# Patient Record
Sex: Female | Born: 1965 | Race: White | Hispanic: No | Smoking: Former smoker
Health system: Southern US, Community
[De-identification: ages and names within clinical notes are randomized; demographics above are authoritative.]

## PROBLEM LIST (undated history)

## (undated) DIAGNOSIS — T819XXA Unspecified complication of procedure, initial encounter: Secondary | ICD-10-CM

## (undated) DIAGNOSIS — F1010 Alcohol abuse, uncomplicated: Secondary | ICD-10-CM

## (undated) DIAGNOSIS — I10 Essential (primary) hypertension: Secondary | ICD-10-CM

## (undated) DIAGNOSIS — R4020 Unspecified coma: Secondary | ICD-10-CM

## (undated) DIAGNOSIS — K852 Alcohol induced acute pancreatitis without necrosis or infection: Secondary | ICD-10-CM

## (undated) HISTORY — PX: GASTRIC BYPASS: SHX52

## (undated) HISTORY — PX: CHOLECYSTECTOMY: SHX55

## (undated) HISTORY — PX: ABDOMINAL SURGERY: SHX537

---

## 1999-06-16 ENCOUNTER — Other Ambulatory Visit: Admission: RE | Admit: 1999-06-16 | Discharge: 1999-06-16 | Payer: Self-pay | Admitting: Obstetrics and Gynecology

## 1999-06-26 ENCOUNTER — Emergency Department (HOSPITAL_COMMUNITY): Admission: EM | Admit: 1999-06-26 | Discharge: 1999-06-26 | Payer: Self-pay | Admitting: Emergency Medicine

## 1999-06-27 ENCOUNTER — Ambulatory Visit (HOSPITAL_COMMUNITY): Admission: EM | Admit: 1999-06-27 | Discharge: 1999-06-27 | Payer: Self-pay | Admitting: Obstetrics and Gynecology

## 1999-06-27 ENCOUNTER — Encounter: Payer: Self-pay | Admitting: Obstetrics and Gynecology

## 1999-06-27 ENCOUNTER — Ambulatory Visit (HOSPITAL_COMMUNITY): Admission: RE | Admit: 1999-06-27 | Discharge: 1999-06-27 | Payer: Self-pay | Admitting: Urology

## 1999-06-27 ENCOUNTER — Encounter: Payer: Self-pay | Admitting: Urology

## 1999-07-05 ENCOUNTER — Encounter (INDEPENDENT_AMBULATORY_CARE_PROVIDER_SITE_OTHER): Payer: Self-pay

## 1999-07-05 ENCOUNTER — Ambulatory Visit (HOSPITAL_COMMUNITY): Admission: RE | Admit: 1999-07-05 | Discharge: 1999-07-05 | Payer: Self-pay | Admitting: Obstetrics and Gynecology

## 2001-11-14 ENCOUNTER — Other Ambulatory Visit: Admission: RE | Admit: 2001-11-14 | Discharge: 2001-11-14 | Payer: Self-pay | Admitting: Obstetrics and Gynecology

## 2003-05-21 ENCOUNTER — Other Ambulatory Visit: Admission: RE | Admit: 2003-05-21 | Discharge: 2003-05-21 | Payer: Self-pay | Admitting: Obstetrics and Gynecology

## 2003-06-10 ENCOUNTER — Encounter: Admission: RE | Admit: 2003-06-10 | Discharge: 2003-09-08 | Payer: Self-pay | Admitting: Internal Medicine

## 2003-12-16 ENCOUNTER — Emergency Department (HOSPITAL_COMMUNITY): Admission: EM | Admit: 2003-12-16 | Discharge: 2003-12-17 | Payer: Self-pay | Admitting: Emergency Medicine

## 2004-04-19 ENCOUNTER — Ambulatory Visit (HOSPITAL_COMMUNITY): Admission: RE | Admit: 2004-04-19 | Discharge: 2004-04-19 | Payer: Self-pay | Admitting: Neurology

## 2004-05-13 ENCOUNTER — Ambulatory Visit (HOSPITAL_COMMUNITY): Admission: RE | Admit: 2004-05-13 | Discharge: 2004-05-13 | Payer: Self-pay | Admitting: Neurology

## 2005-02-02 ENCOUNTER — Other Ambulatory Visit: Admission: RE | Admit: 2005-02-02 | Discharge: 2005-02-02 | Payer: Self-pay | Admitting: Obstetrics and Gynecology

## 2005-06-22 ENCOUNTER — Emergency Department (HOSPITAL_COMMUNITY): Admission: EM | Admit: 2005-06-22 | Discharge: 2005-06-22 | Payer: Self-pay | Admitting: Emergency Medicine

## 2005-07-27 ENCOUNTER — Emergency Department (HOSPITAL_COMMUNITY): Admission: EM | Admit: 2005-07-27 | Discharge: 2005-07-27 | Payer: Self-pay | Admitting: Emergency Medicine

## 2005-08-01 ENCOUNTER — Ambulatory Visit (HOSPITAL_COMMUNITY): Admission: RE | Admit: 2005-08-01 | Discharge: 2005-08-01 | Payer: Self-pay | Admitting: Urology

## 2006-04-12 ENCOUNTER — Observation Stay (HOSPITAL_COMMUNITY): Admission: EM | Admit: 2006-04-12 | Discharge: 2006-04-13 | Payer: Self-pay | Admitting: Family Medicine

## 2006-05-23 ENCOUNTER — Ambulatory Visit (HOSPITAL_COMMUNITY): Admission: RE | Admit: 2006-05-23 | Discharge: 2006-05-23 | Payer: Self-pay | Admitting: Obstetrics and Gynecology

## 2006-05-23 ENCOUNTER — Encounter (INDEPENDENT_AMBULATORY_CARE_PROVIDER_SITE_OTHER): Payer: Self-pay | Admitting: Obstetrics and Gynecology

## 2006-08-15 ENCOUNTER — Ambulatory Visit (HOSPITAL_COMMUNITY): Admission: RE | Admit: 2006-08-15 | Discharge: 2006-08-15 | Payer: Self-pay | Admitting: Surgery

## 2006-08-22 ENCOUNTER — Encounter: Admission: RE | Admit: 2006-08-22 | Discharge: 2006-08-22 | Payer: Self-pay | Admitting: Surgery

## 2006-08-27 ENCOUNTER — Ambulatory Visit (HOSPITAL_COMMUNITY): Admission: RE | Admit: 2006-08-27 | Discharge: 2006-08-27 | Payer: Self-pay | Admitting: Surgery

## 2007-02-26 ENCOUNTER — Emergency Department (HOSPITAL_COMMUNITY): Admission: EM | Admit: 2007-02-26 | Discharge: 2007-02-26 | Payer: Self-pay | Admitting: Emergency Medicine

## 2007-03-21 ENCOUNTER — Encounter: Admission: RE | Admit: 2007-03-21 | Discharge: 2007-06-19 | Payer: Self-pay | Admitting: Surgery

## 2007-04-05 ENCOUNTER — Ambulatory Visit (HOSPITAL_COMMUNITY): Admission: RE | Admit: 2007-04-05 | Discharge: 2007-04-05 | Payer: Self-pay | Admitting: Surgery

## 2007-04-09 ENCOUNTER — Inpatient Hospital Stay (HOSPITAL_COMMUNITY): Admission: RE | Admit: 2007-04-09 | Discharge: 2007-04-12 | Payer: Self-pay | Admitting: Surgery

## 2007-04-10 ENCOUNTER — Ambulatory Visit: Payer: Self-pay | Admitting: *Deleted

## 2007-04-10 ENCOUNTER — Encounter (INDEPENDENT_AMBULATORY_CARE_PROVIDER_SITE_OTHER): Payer: Self-pay | Admitting: Surgery

## 2007-04-18 ENCOUNTER — Inpatient Hospital Stay (HOSPITAL_COMMUNITY): Admission: EM | Admit: 2007-04-18 | Discharge: 2007-05-17 | Payer: Self-pay | Admitting: Emergency Medicine

## 2007-04-26 ENCOUNTER — Encounter (INDEPENDENT_AMBULATORY_CARE_PROVIDER_SITE_OTHER): Payer: Self-pay | Admitting: General Surgery

## 2008-06-26 ENCOUNTER — Emergency Department (HOSPITAL_COMMUNITY): Admission: EM | Admit: 2008-06-26 | Discharge: 2008-06-26 | Payer: Self-pay | Admitting: Emergency Medicine

## 2008-12-16 IMAGING — CT CT PELVIS W/ CM
2 of 5 series · 17 of 46 positions shown, 19 images · IV contrast (agent unspecified)
Comparison: 05/03/2007

CT ABDOMEN

CLINICAL DATA: Gastric - jejunal bypass revision.  Abdominal and
pelvic pain.

CT ABDOMEN AND PELVIS WITH CONTRAST
TECHNIQUE: Multidetector CT imaging of the abdomen and pelvis was
performed using the standard protocol following bolus
administration of intravenous contrast.
Contrast: 100 ml intravenous Smnipaque-MRR

[Series 2: abd_pel 5.0 b40s · axial · 0.80mm/px · z∈[-608,-208]mm · 14 of 90 slices shown, 16 images]
[im 5/90  soft-tissue]
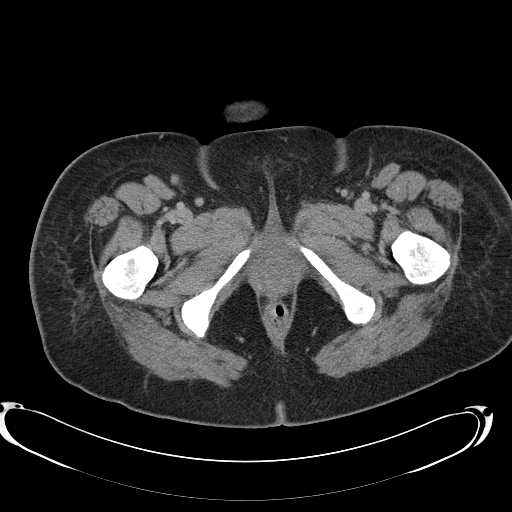
[im 5/90  bone]
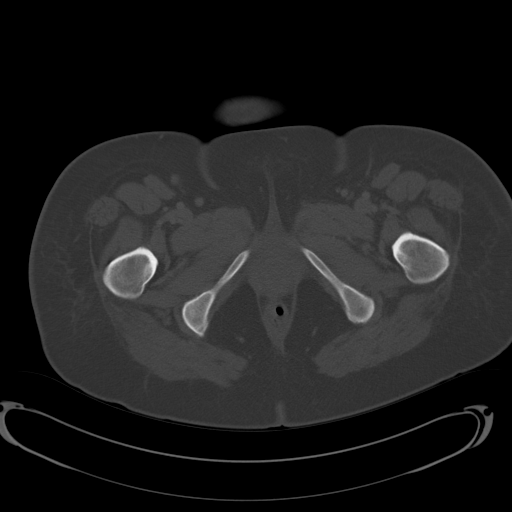
[im 10/90  soft-tissue]
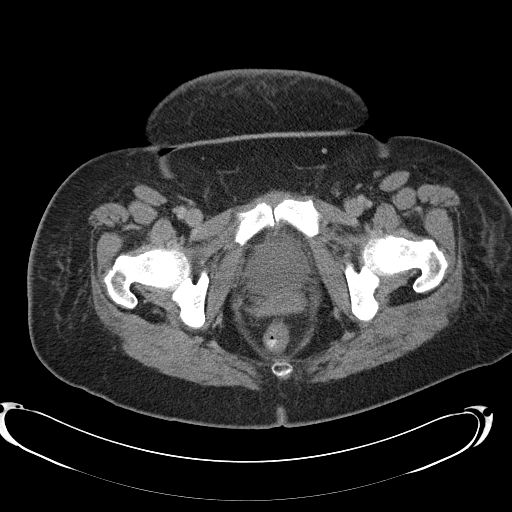
[im 20/90  soft-tissue]
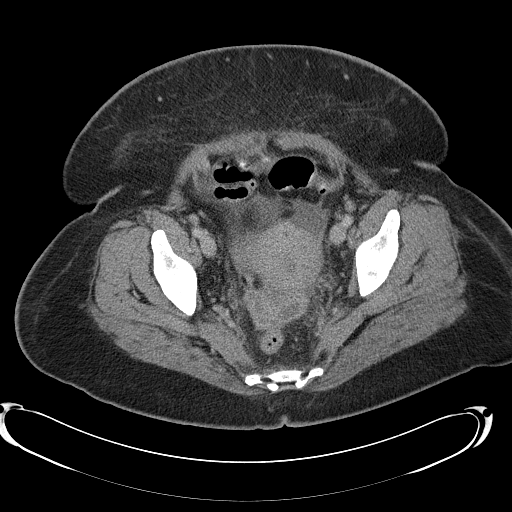
[im 25/90  soft-tissue]
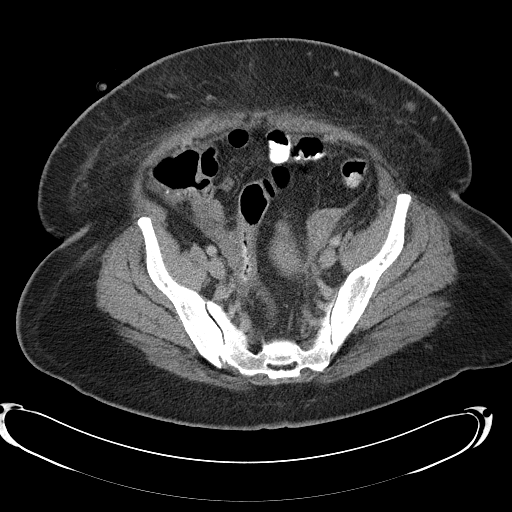
[im 30/90  soft-tissue]
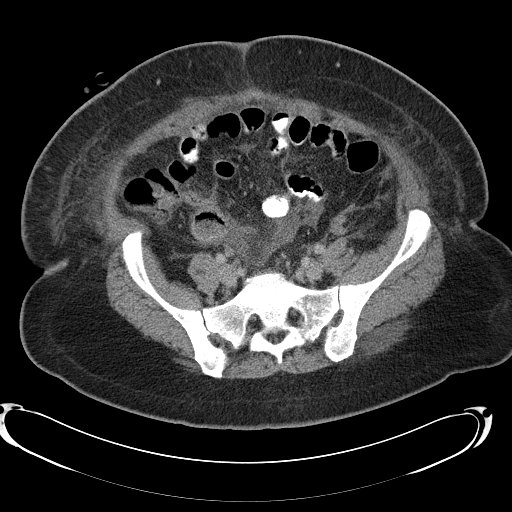
[im 35/90  soft-tissue]
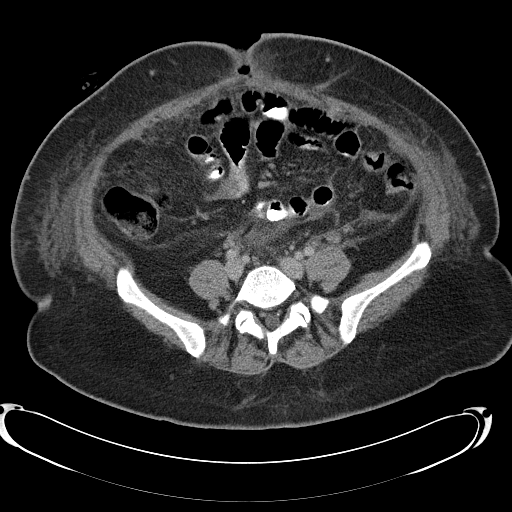
[im 40/90  soft-tissue]
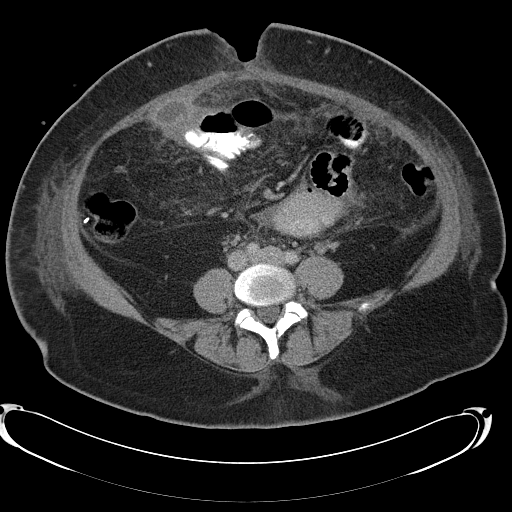
[im 50/90  soft-tissue]
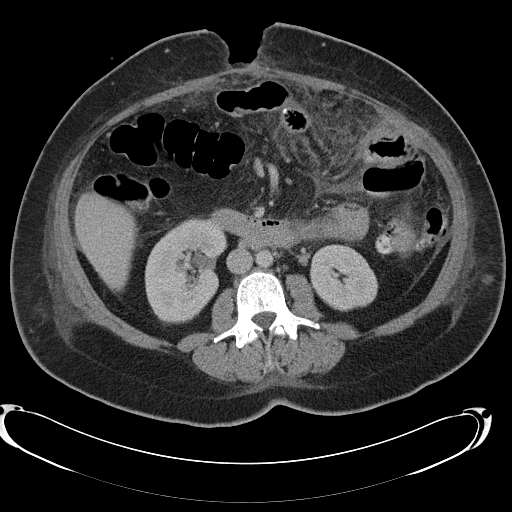
[im 55/90  soft-tissue]
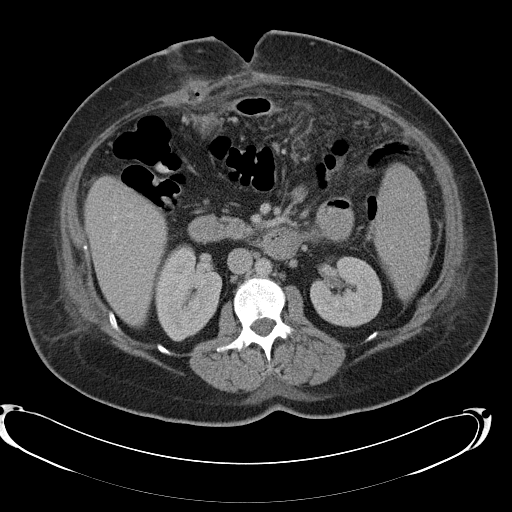
[im 55/90  bone]
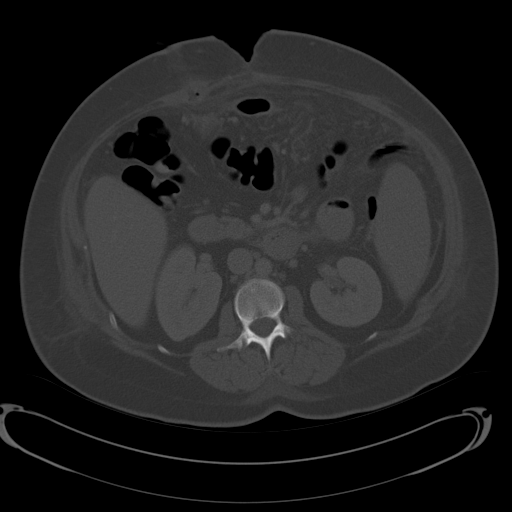
[im 60/90  soft-tissue]
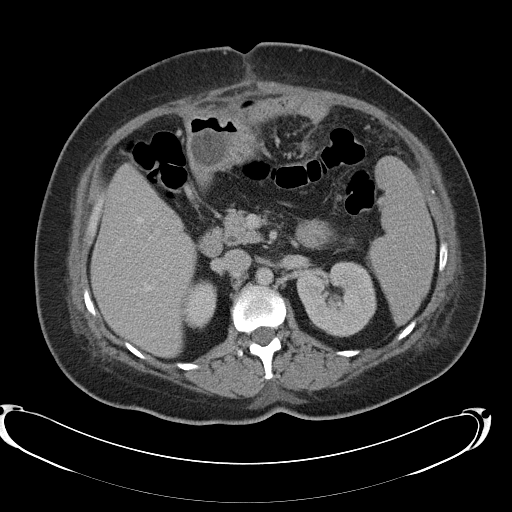
[im 65/90  soft-tissue]
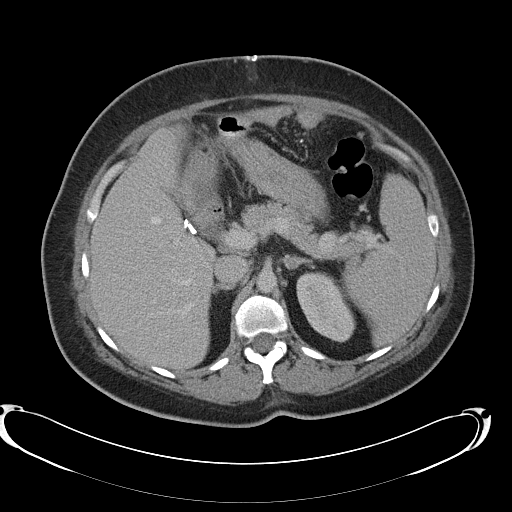
[im 70/90  soft-tissue]
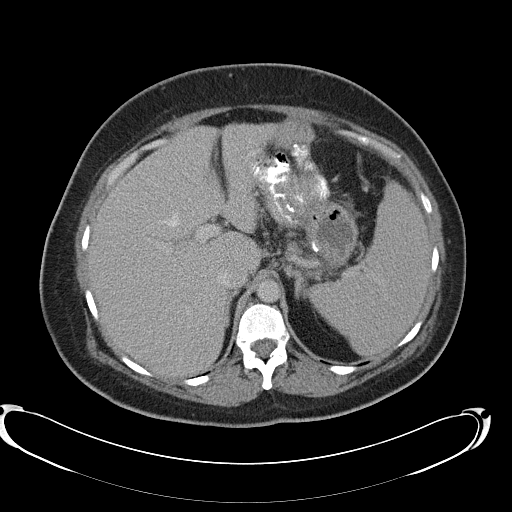
[im 80/90  soft-tissue]
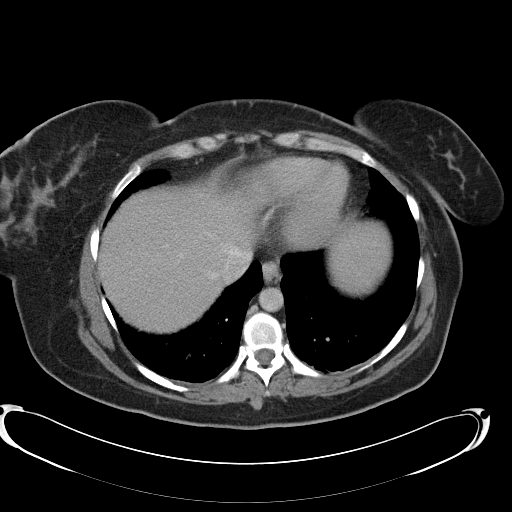
[im 85/90  soft-tissue]
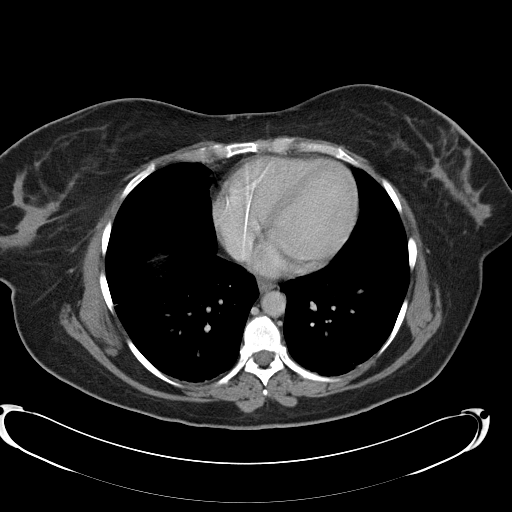

[Series 602: <mpr thick range> · coronal · 0.88mm/px · 3 of 97 slices shown]
[im 33/97  soft-tissue]
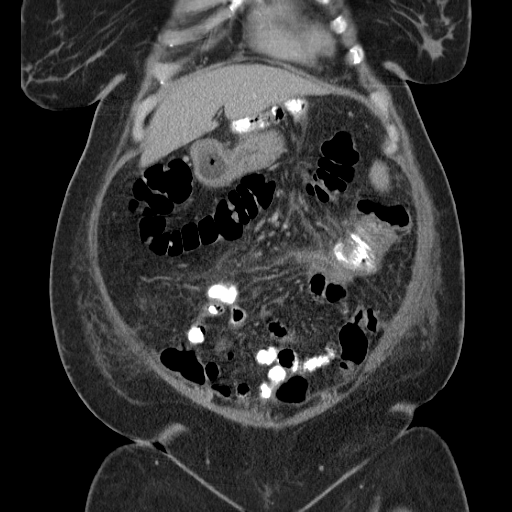
[im 43/97  soft-tissue]
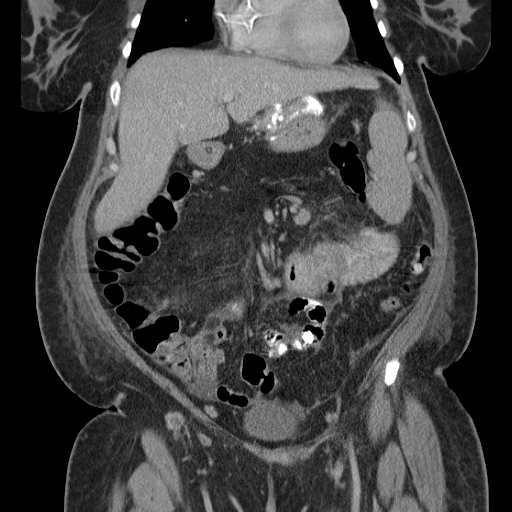
[im 54/97  soft-tissue]
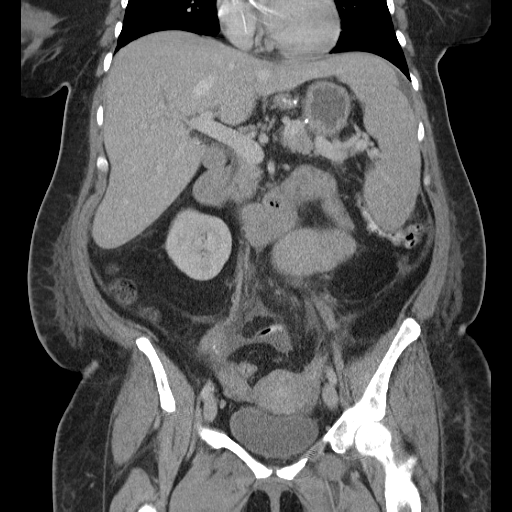

[17 of 46 positions shown; findings below may reference images not displayed]

FINDINGS: Hepatosplenomegaly again identified without focal
lesions.
The pancreas kidneys, and adrenal glands are unremarkable.
The patient is status post cholecystectomy.

A tiny amount of ascites has decreased since the prior study.
Anterior abdominal fluid collections have decreased in size and
include:
A 1.4 x 2.7 cm anterior right abdominal collection (image 51),
previously measuring 2.5 x 4.6 cm.
A 2 x 3 cm anterior left abdominal collection (image 46),
previously measuring 2.4 x 4 cm.

Mesenteric stranding/inflammation has decreased since the prior
study.
Postoperative changes and a percutaneous gastrostomy tube again
identified.
No evidence of pneumoperitoneum or bowel obstruction.
Mildly distended small bowel loops within the left abdomen again
noted - likely focal ileus.
IMPRESSION: Postoperative changes with decreasing anterior abdominal fluid
collections, mesenteric inflammation and tiny ascites.

CT PELVIS
FINDINGS: Small amount of ascites has slightly decreased since
last study.
A 1.8 x 3.4 cm anterior perirectal collection (image 72) has
decreased in size, previously measuring 2.5 x 4.7 cm.
Postoperative changes and mesenteric/subcutaneous edema again
identified but improved.
No evidence of dilated bowel loops or pneumoperitoneum. No evidence
of bowel obstruction.
IMPRESSION: Smaller anterior perirectal collection and decreasing small amount
of ascites.

## 2009-01-25 ENCOUNTER — Ambulatory Visit (HOSPITAL_COMMUNITY): Admission: RE | Admit: 2009-01-25 | Discharge: 2009-01-25 | Payer: Self-pay | Admitting: Surgery

## 2009-10-22 ENCOUNTER — Inpatient Hospital Stay (HOSPITAL_COMMUNITY): Admission: RE | Admit: 2009-10-22 | Discharge: 2009-10-25 | Payer: Self-pay | Admitting: Surgery

## 2010-01-03 ENCOUNTER — Emergency Department (HOSPITAL_COMMUNITY)
Admission: EM | Admit: 2010-01-03 | Discharge: 2010-01-03 | Payer: Self-pay | Source: Home / Self Care | Admitting: Emergency Medicine

## 2010-01-15 ENCOUNTER — Encounter
Admission: RE | Admit: 2010-01-15 | Discharge: 2010-01-15 | Payer: Self-pay | Source: Home / Self Care | Attending: Orthopaedic Surgery | Admitting: Orthopaedic Surgery

## 2010-01-23 ENCOUNTER — Encounter: Payer: Self-pay | Admitting: Gastroenterology

## 2010-03-16 LAB — CBC
HCT: 36.9 % (ref 36.0–46.0)
HCT: 46.7 % — ABNORMAL HIGH (ref 36.0–46.0)
MCV: 90.9 fL (ref 78.0–100.0)
Platelets: 177 10*3/uL (ref 150–400)
Platelets: 252 10*3/uL (ref 150–400)
RBC: 5.14 MIL/uL — ABNORMAL HIGH (ref 3.87–5.11)
RDW: 11.9 % (ref 11.5–15.5)
RDW: 12.4 % (ref 11.5–15.5)
WBC: 5.6 10*3/uL (ref 4.0–10.5)
WBC: 6.4 10*3/uL (ref 4.0–10.5)

## 2010-03-16 LAB — DIFFERENTIAL
Basophils Absolute: 0 10*3/uL (ref 0.0–0.1)
Lymphocytes Relative: 25 % (ref 12–46)
Neutro Abs: 3.5 10*3/uL (ref 1.7–7.7)

## 2010-03-16 LAB — SURGICAL PCR SCREEN: MRSA, PCR: NEGATIVE

## 2010-03-20 LAB — GLUCOSE, CAPILLARY: Glucose-Capillary: 102 mg/dL — ABNORMAL HIGH (ref 70–99)

## 2010-03-20 LAB — POCT I-STAT EG7
Acid-Base Excess: 2 mmol/L (ref 0.0–2.0)
Calcium, Ion: 1.19 mmol/L (ref 1.12–1.32)
HCT: 40 % (ref 36.0–46.0)
Sodium: 141 mEq/L (ref 135–145)

## 2010-03-20 LAB — CBC
MCHC: 34.5 g/dL (ref 30.0–36.0)
MCV: 94.7 fL (ref 78.0–100.0)
Platelets: 225 10*3/uL (ref 150–400)

## 2010-04-11 LAB — DIFFERENTIAL
Eosinophils Relative: 2 % (ref 0–5)
Lymphocytes Relative: 28 % (ref 12–46)
Lymphs Abs: 1.2 10*3/uL (ref 0.7–4.0)
Monocytes Absolute: 0.3 10*3/uL (ref 0.1–1.0)

## 2010-04-11 LAB — URINALYSIS, ROUTINE W REFLEX MICROSCOPIC
Bilirubin Urine: NEGATIVE
Glucose, UA: 250 mg/dL — AB
Hgb urine dipstick: NEGATIVE
Ketones, ur: NEGATIVE mg/dL
pH: 6.5 (ref 5.0–8.0)

## 2010-04-11 LAB — COMPREHENSIVE METABOLIC PANEL
ALT: 13 U/L (ref 0–35)
AST: 21 U/L (ref 0–37)
Albumin: 4.3 g/dL (ref 3.5–5.2)
Calcium: 9.3 mg/dL (ref 8.4–10.5)
Chloride: 107 mEq/L (ref 96–112)
Creatinine, Ser: 0.48 mg/dL (ref 0.4–1.2)
GFR calc Af Amer: >60 mL/min (ref >60)
Sodium: 141 mEq/L (ref 135–145)

## 2010-04-11 LAB — CBC
MCV: 95.2 fL (ref 78.0–100.0)
Platelets: 174 10*3/uL (ref 150–400)
WBC: 4.3 10*3/uL (ref 4.0–10.5)

## 2010-05-17 NOTE — Discharge Summary (Signed)
[name], BRACKNEY                 ACCOUNT NO.:  [id number]   MEDICAL RECORD NO.:  [id number]          PATIENT TYPE:  INP   LOCATION:  1534                         FACILITY:  [facility]   PHYSICIAN:  Sandria Bales. Ezzard Standing, M.D.  DATE OF BIRTH:  1965-08-16   DATE OF ADMISSION:  04/09/2007  DATE OF DISCHARGE:  04/12/2007                               DISCHARGE SUMMARY   Date of admission and discharge ???   DISCHARGE DIAGNOSES:  1. Morbid obesity with a body mass index of 36.5, weight of 198.  2. Insulin-dependent diabetes mellitus.  3. Hyperlipidemia.  4. History of prior rhythm problems.  5. Gastroesophageal reflux.  6. History of kidney stones.  7. Osteoarthritis, left hip.  8. 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   OPERATION PERFORMED:  The patient had a laparoscopic Roux-en-Y gastric  bypass on April 09, 2007, she also had upper endoscopy at the same time.   HISTORY OF PRESENT ILLNESS:  Ms. [name] is a 45 year old white female who  is a patient Dr. Maurice Small and Dr. Dorisann Frames who has been  through our preop bariatric program.  She has tried multiple diets  trying to control her weight but has not been very successful.  She is  interested in bypass, particularly because of hopeful improvement of her  diabetes.   She has had multiple comorbid problems which include her rhythm problem,  for which she was seen by Dr. Garnette Scheuermann.  She is on lisinopril for  this.  She has a history of reflux.  She has also had some trouble with  elevated liver function secondary to some statins she has taken.  She  had a funny ataxia in 2008.  She was found to have some kind of aberrant  vessel, but this self corrected, and she is an insulin-dependent  diabetic.   PHYSICAL EXAMINATION:  VITAL SIGNS:  On physical exam her weight is 206  pounds, BMI of 37.2.  GENERAL:  She is a well-nourished, pleasant white female.  ABDOMEN:  Her abdomen is benign with no organomegaly or mass.   She is a  little more pear shaped than apple shaped.   HOSPITAL COURSE:  On the day of admission, she underwent a laparoscopic  Roux-en-Y gastric bypass and upper endoscopy.  Postoperatively on the  first day, she had a white blood count 6600, hemoglobin of 12.  She  tolerated her swallow well without any obstruction.  However she had a  fair amount of pain on the second postoperative day and because of this  we kept her a third day.   By the third postoperative day her lower abdominal pain was much better.  Her white blood cell count was 5100, hemoglobin 11, temperature 98.8,  pulse 86, and she was ready for discharge.   DISCHARGE INSTRUCTIONS:  Bariatric protein diet.  She could shower.  She  should not drive for 3-5 days.  She will see me back in about 10 days after discharge, and was given  Roxicet elixir for pain to be discharged home  on.  She  was going to monitor her diabetes closely.  She was going to  continue her Cozaar, but hold her other medicines this time.      Sandria Bales. Ezzard Standing, M.D.  Electronically Signed     DHN/MEDQ  D:  05/07/2007  T:  05/07/2007  Job:  [id number]   cc:   Gretta Arab. Valentina Lucks, M.D.  Fax: [phone]   Dorisann Frames, M.D.

## 2010-05-17 NOTE — H&P (Signed)
[name]                 ACCOUNT NO.:  [id number]   MEDICAL RECORD NO.:  [id number]          PATIENT TYPE:  INP   LOCATION:  0112                         FACILITY:  [facility]   PHYSICIAN:  [name], M.D.  DATE OF BIRTH:  10-19-1965   DATE OF ADMISSION:  04/18/2007  DATE OF DISCHARGE:                              HISTORY & PHYSICAL   This is a H&P.  Place date here ??   HISTORY OF PRESENT ILLNESS:  [name] is a 45 year old, white female  who is morbidly obese and went through our bariatric program for  bariatric surgery. She sees Dr. Maurice Small as her primary medical  doctor, sees Dr. Dorisann Chen for diabetes, particularly insulin  dependent diabetes mellitus.   She underwent a laparoscopic roux-en-Y gastric bypass on April 09, 2007.  Her preop weight was 298 with a BMI of 36.5.   She did well from her operation and was in the hospital for 3 days and  then discharged. I saw her in the office yesterday where I removed her  staples and she was doing well.   Unfortunately last night, she ate some meat from a friend's house which  was quite a bit off her prescribed bariatric diet. Within about 4-6  hours after eating this meat, she developed severe epigastric abdominal  pain, nausea, retching and vomiting.   She is somewhat better this morning but I had her come to the emergency  room for further evaluation.   The remainder of her past medical history is outlined in my H&P from  just 9 days ago.   Her past medical history:  1. 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 Hyperlipidemia  3. Cardiac dysrythmia, has seen Dr. Mendel Chen in the past  4. GERD  5. Elevated liver function tests attributed to statin drugs  6. History of kidney stones  7. left hip pain, degenerative joint disease, partly due to weight  8. Insulin dependent diabetes mellitus   Medications (prior to recent  surgery)  1. Metformin  2. Lisinopril  3. Prilosec  4. Zetia  5. Aspirin  6. Humalog  7. Lantus insulin   PHYSICAL EXAMINATION:  Her temperature is 96.4, pulse 60, respirations  18, blood pressure 90/50.  HEENT:  Unremarkable.  NECK:  Supple. She has no tenderness.  LUNGS:  Symmetric to auscultation with no rub.  HEART:  Regular rate and rhythm without murmur or rub.  ABDOMEN:  She has discomfort and sore in her epigastrium but no real  peritoneal signs or guarding. Her wounds all look good. There is no mass  or nodule at any wound site nor is there any pain at any specific wound  site. She does have significantly  decreased bowel sounds.  EXTREMITIES:  She has good strength in all 4 extremities.  NEUROLOGIC:  Grossly intact.   LABORATORY DATA:  Labs that I have show a white blood count of 6600,  hemoglobin 14. Her platelet count is 231,000, her sodium is 139,  potassium 3.4, chloride of 106, CO2 of 18, glucose of 154, BUN of 14,  creatinine of 0.7 and serum amylase is 52.   IMPRESSION:  1. Status post roux-en-Y gastric bypass. I think the patient got off      her diet and ate the wrong things too early.  Though with her      significant pain I am still concerned about some other catastrophe.      I discussed this with her and plan to go in and CT her abdomen      today. I also plan to admit her in the hospital and at least keep      her overnight for observation, pain control and further evaluation      and will repeat labs in the morning though I really think this is      more of an indiscreet diet than it is a true surgical problem.  2. Morbid obesity.  3. Insulin dependent diabetes mellitus. She has actually been off her      insulin since she has gone home and her blood sugars have been      under control staying below 120.  4. GERD  5. Hyperlipidemia      [name], M.D.  Electronically Signed     DHN/MEDQ  D:  04/18/2007  T:  04/18/2007  Job:   [id number]   cc:   [name], M.D.  Fax: [phone]   [name], M.D.  Fax: [phone]

## 2010-05-17 NOTE — Discharge Summary (Signed)
NAMEREIS, PIENTA                 ACCOUNT NO.:  [id number]   MEDICAL RECORD NO.:  [id number]          PATIENT TYPE:  INP   LOCATION:  1523                         [facility]ACILITY:  [facility]   PHYSICIAN:  Sandria Bales. Ezzard Standing, M.D.  DATE OF BIRTH:  Oct 04, 1947   DATE OF ADMISSION:  04/18/2007  DATE OF DISCHARGE:  05/17/2007                               DISCHARGE SUMMARY   Date of admission and discharge ???   DISCHARGE DIAGNOSES:  1. Small bowel obstruction at jejunojejunostomy.  1a. Status post laparoscopic Roux-en-Y gastric bypass.  1. Abdominal wound infection.  2. Decompressive gastrostomy tube.  3. Morbid obesity.  4. Insulin-dependent diabetes mellitus.  5. History of hyperlipidemia.  6. Gastroesophageal reflux disease.  7. History of kidney stones.  8. Left hip pain and degenerative joint disease.  9. Remote history of cardiac dysrhythmia.  10.Remote history of ataxia.   OPERATIONS PERFORMED:  1. Laparoscopic lysis of adhesions April 18, 2007 by Dr. Leonard Schwartz. Hoxworth.  2. Laparoscopic jejunojejunostomy on April 22, 2007 by Dr. Algis Downs. Newman.  3. Open Revision of jejunojejunostomy on April 26, 2007 by Dr. Leonard Schwartz.      Hoxworth.   HISTORY OF ILLNESS:  Ms. Musgrave is a 45 year old white female who is  morbidly obese and has been through our bariatric program, and was  interested in Roux-en-Y gastric bypass.  She sees Dr. Maurice Small as  her primary medical doctor and Dr. Dorisann Frames for diabetes.   She underwent a laparoscopic Roux-en-Y gastric bypass on April 09, 2007.  Her preop weight was 298 with a BMI of 36.5.  She did well in the  hospital.  She was hospitalized for 3 days and discharged home without  incident.  I saw her the day prior to this admission on April 17, 2007  in our office and she was doing well.  However that evening, she ate  some food at a friend's house and presented to the [facility] Emergency  Room with epigastric abdominal pain, nausea and vomiting.   HOSPITAL COURSE:   On admission, she appeared to have a high-grade  obstruction at her jejunojejunostomy. Dr. Johna Sheriff took her to the  operating room where he did a laparoscopic lysis of adhesions on April 18, 2007.  She appeared to be more comfortable and an upper GI was done.   She seemed to have some improvement in her symptoms.  Did have a partial  blockage of her jejunojejunostomy, however, contrast did get around to  her colon.  However over several days, she continued to show this  partial blockage of her jejunojejunostomy.  I talked her into going back  to the operating room on April 22, 2007 for a laparoscopic  jejunojejunostomy.  She seemed to do well from this operation.  She was  kept n.p.o., but really never progressed after the second surgery.   On April 26, 2007 which was 4 days after her second operation, she had  increasing in  tenderness.  A CT scan was done which showed recurrent  obstruction of the jejunojejunostomy.  She appeared worse from Dr.  Hoxworth's standpoint, so he took her back to the operating room where  she underwent an open revision of her jejunojejunostomy.  He also placed  a tube in her gastric remnant.   She also had an NG tube placed and this was left in for about 3-4 days.  She was placed on TPN.   By April 29, 2007 which would have been postop day number 3 after her  third operation, her labs showed a sodium 138, potassium 3.4, chloride  102, her albumin was 1.6, prealbumin 3.1, hemoglobin 10, white blood  count 7500.  She had been placed on Zosyn as antibiotic after the last  operation on April 30, 2007 which was 4 days after the third operation.  She did have a wound infection and we had to open her wound up.   By the fifth postoperative day on May 01, 2007, she developed some  right-sided facial pain which was felt to be probable sinusitis from her  NG tube.  The NG tube was taken out or fell out and her right facial  pain seemed to improve.   A repeat  CT scan was done on May 03, 2007.  This was 7 days after her  third operation.  It showed no obstruction.  Showed a small amount of  intra-abdominal fluid which was not organized to an abscess yet.  We  decided to observe.   By May 06, 2007 which was her tenth postoperative day after her third  operation, her white blood count was 6200.  Her sodium 4.0, creatinine  0.5, albumin 2.0.  She seemed to be making steady progress.  She did  develop some swelling of her lower extremities which was symmetric and I  thought was probably as much nutritional as anything else.   By May 09, 2007 which was her 13th postoperative day after her third  operation, she was still on the Zosyn.  Her white blood count was 5,400,  hemoglobin 9.4, albumin 2.3.  She had a bowel movement.  She was also  having some trouble with nausea.  Another CT scan was performed at this  time and showed evidence of some resolving of these intraperitoneal  fluid collections.  There was no new abscess.  There was no evidence of  obstruction.   By May 16, 2007 which is 19 days after her third operation, she seemed  to be doing well.  I stopped all her IV fluids and TPN.  Her wounds  seemed to be healing and by May 17, 2007, she was ready for discharge.   DISCHARGE INSTRUCTIONS:  1. Continue on her bariatric diet.  2. She was given Percocet elixir for pain.  3. She was to flush her NG tube daily with 30 cc of water.  4. We arranged home health care to help with her abdominal wound.  5. She will try to increase her physical activity.  6. See me back in 1 week for follow up wound care and make      arrangements to see the dietician.   CONDITION ON DISCHARGE:  Improved.      Sandria Bales. Ezzard Standing, M.D.  Electronically Signed     DHN/MEDQ  D:  05/28/2007  T:  05/28/2007  Job:  [id number]   cc:   Gretta Arab. Valentina Lucks, M.D.  Fax: [phone]   Dorisann Frames, M.D.

## 2010-05-17 NOTE — Op Note (Signed)
[name], [name]                 ACCOUNT NO.:  [id number]   MEDICAL RECORD NO.:  [id number]         PATIENT TYPE:  LINP   LOCATION:                               FACILITY:  [facility]   PHYSICIAN:  Sandria Bales. Ezzard Standing, M.D.  DATE OF BIRTH:  1965/09/04   DATE OF PROCEDURE:  04/22/2007  DATE OF DISCHARGE:                               OPERATIVE REPORT   Date of surgery??   PREOP DIAGNOSIS:  Small bowel obstruction   POST OP DIAGNOSIS:  Small bowel obstruction at the jejuno-jejunal anastomosis   PROCEDURE:  Laparoscopic Jejuno-jejunal anastomosis (2nd JJ)   SURGEON:  Ovidio Kin, M.D.   1ST ASSISTANT:  Jaclynn Guarneri, M.D.   ANESTHESIA:  GET   HISTORY OF PRESENT ILLNESS:  This is a 45 year old white female who  underwent a laparoscopic Roux-en-Y gastric bypass on April 09, 2007.  She  had an uneventful postop course, actually saw in the office on April 17, 2007, where she appeared to be doing well.  I removed her staples at  that visit.  However, on April 16, she got sick with nausea and vomiting  particularly after eating some [place] meat.  CT scan at that time  revealed what looked like a bowel obstruction at or near the JJ  anastomosis.  She was taken to the operating room by Dr. Johna Sheriff on  April 18, 2007.  He was concerned that the suture which closed  Peterson's defect may be causing part of the obstruction, he cut the  suture.  She did well for 2 days until Sunday, April 21, 2007, when she  again started having increasing abdominal distension and discomfort and  her x-ray showed increasing dilated proximal small bowel with  decompressed distal bowel.  I spoke to her about the options, but I  think she needs to go back to the operating room for a revision of her  jejunojejunostomy.   We discussed the indication and potential complications.  Potential  complication include bleeding, infection, bowel resection and that she  may need open surgery.  The case was discussed  at length and I think I  answered all her question.   OPERATIVE NOTE:  The patient was placed in the supine position.  Her  abdomen was prepped with Technicare and sterilely draped.  Staples of  her prior wounds were removed.  She was given a gram of cefoxitin to  initiate the procedure.  I reopened her wounds and introduced my finger  to the peritoneal cavity at each wound and got to the belly without a  lot of trouble.  Upon exploration, there was no obvious perforation.  There was no ischemic bowel.  She did have a couple of areas a little  bit of exudative like some irritation.   I teased off all the tissues around the JJ, but I really could not  expose it completely, so I was hesitant to go probing in thinking having  to do a whole different operation.  But, it looked like her proximal or  gastric limb was dilated and erythematous, her common channel distal  to  the JJ is decompressed and not inflamed.  It looked like it made sense  to just create a new JJ approximately 4 to 6 inches proximal to the old  JJ and this ought to decompress both her gastric limb and empty the  common channel.  Of note, the biliary limb was not dilated.  I do not  think I need to drain or revise that and it appeared to be draining  adequately through her jejunoanastomosis.  The reason for that is  somewhat unclear, for I should drain well and the other one should not.   Anyway, I lined up the 2 limbs of bowel, put a 2-0 silk suture in the  bowel.  I did an enterotomy and did anastomosis with a 45mm white load  of the [employer] GIA stapler.  I then close the enterotomy with  running 2-0 Vicryl suture, put 2 additional sutures and I did a crotch  stitch at the more distal end which actually was opposite the staple  line.   The anastomosis looked patent there was no evidence of any ischemic  bowel.  I then placed Tisseal over the wound, I did irrigate the abdomen  with a liter of saline.  I made sure  as best I could, the most of bowel  was port of the gastric limb was stuck up in the liver.  I did try to  tease this off again for fear of tearing a hole in the bowel, but  clearly it was dilated and stuff was getting through at that level.   The patient tolerated the procedure well.  Each trocars were removed in  turn, I infiltrated the trocar sites with 20 mL of 1% Marcaine and then  I put staples on the wound and keep her n.p.o. overnight.  I will  probably get some x-rays in the morning and go from there.      Sandria Bales. Ezzard Standing, M.D.  Electronically Signed     DHN/MEDQ  D:  04/22/2007  T:  04/23/2007  Job:  [id number]

## 2010-05-17 NOTE — Op Note (Signed)
[name], [name]                 ACCOUNT NO.:  [id number]   MEDICAL RECORD NO.:  [id number]          PATIENT TYPE:  INP   LOCATION:  1534                         [facility]ACILITY:  [facility]   PHYSICIAN:  Sharlet Salina T. Hoxworth, M.D.DATE OF BIRTH:  07/12/1965   DATE OF PROCEDURE:  04/09/2007  DATE OF DISCHARGE:                               OPERATIVE REPORT   PROCEDURE:  Upper GI endoscopy.   DESCRIPTION OF PROCEDURE:  Upper GI endoscopy is performed at the  completion of laparoscopic Roux-en-Y gastric bypass by Dr. Ovidio Kin.  The Olympus video endoscope was inserted into the upper esophagus and  then passed under direct vision to the EG junction at 39 cm.  The small  gastric pouch was entered and then was tensely distended with air while  the outlet was clamped under saline irrigation by Dr. Ezzard Standing and there  was no evidence of leak.  The pouch measured about 4 cm in length.  The  anastomosis was patent.  Staple and suture lines were intact and without  bleeding.  No mucosal abnormalities.  Following this, the pouch was  desufflated and the scope withdrawn.      Lorne Skeens. Hoxworth, M.D.  Electronically Signed     BTH/MEDQ  D:  04/09/2007  T:  04/10/2007  Job:  [id number]

## 2010-05-17 NOTE — Op Note (Signed)
NAMEJOJO, GEVING                 ACCOUNT NO.:  [id number]   MEDICAL RECORD NO.:  [id number]          PATIENT TYPE:  INP   LOCATION:  1533                         FACILITY:  [facility]   PHYSICIAN:  Sharlet Salina T. 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   SURGICAL PROCEDURES:  Laparoscopy and lysis of adhesions.   SURGEON:  Lorne Skeens. Hoxworth, M.D.   ASSISTANTAngelia Mould. Derrell Lolling, M.D.   ANESTHESIA:  General.   BRIEF HISTORY:  [name] is a 45 year old female who is nine days  following apparently uneventful laparoscopic Roux-En-Y gastric bypass.  She was discharged home the second postoperative day doing well and had  been doing well until about 24 hours ago.  She had her first solid meal  the day before.  Last night, she developed intermittent but severe sharp  and stabbing epigastric pain associated with nausea and vomiting.  The  patient was admitted today.  CT scan was performed this afternoon which  shows an apparent obstruction at her jejunojejunostomy with dilation of  the gastric pouch and Roux limb down to the jejunojejunostomy with a  nondilated biliopancreatic limb and distal common channel.  With this  finding, I have recommended proceeding with emergency laparoscopy for  evaluation and treatment.  The nature of the procedure, indications,  risks of bleeding, infection, intestinal injury, possible need for open  procedure, have been discussed and understood with the patient and her  family.  She is brought to the operating room for this procedure.   DESCRIPTION OF OPERATION:  The patient was brought to the operating room  and placed in the supine position on the  operating table and general  endotracheal anesthesia was induced.  She was given broad spectrum IV  antibiotics.  The abdomen was widely sterilely prepped and draped.  A  Foley catheter was placed.  The correct patient and procedure were  verified.  The laparoscopic incisions were healing nicely but could be  easily opened with digital exploration and the left subcostal incision  was bluntly opened and an OptiVu trocar easily passed under direct  vision into the peritoneal cavity and pneumoperitoneum established.  Under direct vision, additional 11 mm trocars were placed in the low  camera port, periumbilical, and in the right and left upper quadrant.  The Roux limb was identified going up over the transverse colon and did  appear somewhat dilated, although not markedly so.  It certainly was not  tensely dilated.  The gastric pouch was visualized and the  gastrojejunostomy was visualized and there was no evidence of leak or  inflammatory change in this area.  The Roux limb was then followed  distally and was free and it appeared normal other than moderate  dilatation down to the jejunojejunostomy.  There were some inflammatory  adhesions of the omentum down  onto the surface of the jejunojejunostomy  and these were carefully bluntly dissected away and completely mobilized  off the anastomosis.  There was some moderate inflammation around the  anastomosis as would be expected at nine days postoperatively.  There  was certainly no evidence of leak, abscess or enteric content anywhere.  The anastomosis appeared to be somewhat fixed down toward the transverse  mesocolon and it could be seen that the silk suture placed to close  Peterson's defect did incorporate a portion of the small bowel mesentery  near the anastomosis with some posterior fixation of the anastomosis.  I, therefore, exposed that stitch and removed it and this allowed the  jejunojejunostomy to more freely come anteriorly and be  much more mobile  on its mesentery.  At this point, the anastomosis was carefully  inspected and we could see all aspects of it including the closed end of  the biliopancreatic limb, the anastomosis itself, and the small bowel  lumen could be seen to be traced from the Roux limb down along the  jejunojejunostomy down to the common channel.  There was some caliber  change passing the anastomosis.  I did attempt to milk some contents  from the Roux limb down past the anastomosis and it did go through, but  possibly went more up into biliary limb in the common channel and this  was really somewhat hard to assess laparoscopically.  However, the  common channel did certainly have some air and fluid in it and was not  completely decompressed as would be expected with a high grade  obstruction and again, the Roux limb, although somewhat dilated, was  certainly not in any way tensely dilated.  At this point, I thought if  any further surgical intervention was carried out the only thing we  could do would be to make a small laparotomy incision and bring the  jejunojejunostomy out and redo it.  However, I felt that with the lysis  of adhesions, detethering of the mesentery, that quite likely we had  improved flow through the anastomosis and certainly I did not see any  evidence of very high grade obstruction, ischemia, leak, internal hernia  or anything that would put her at any sort of immediate risk.  Therefore, I thought it was wisest to stop the procedure at this point  and plan a contrast GI series tomorrow morning to confirm that, indeed,  we now have flow through the jejunojejunostomy.  The area was inspected  for hemostasis and for any evidence of trocar injury and everything  looked fine.  The CO2 was evacuated and the trocars removed.  The skin  incisions were closed with staples.  The patient tolerated the procedure  well.  She was taken to recovery in good condition.      Lorne Skeens. Hoxworth, M.D.  Electronically Signed     BTH/MEDQ  D:  04/18/2007  T:  04/18/2007  Job:  [id number]

## 2010-05-17 NOTE — Op Note (Signed)
NAMESTEPANIE, Chen                 ACCOUNT NO.:  [id number]   MEDICAL RECORD NO.:  [id number]          PATIENT TYPE:  INP   LOCATION:  1230                         FACILITY:  [facility]   PHYSICIAN:  Sharlet Salina T. Hoxworth, M.D.DATE OF BIRTH:  03/28/1965   DATE OF PROCEDURE:  04/26/2007  DATE OF DISCHARGE:                               OPERATIVE REPORT   PREOPERATIVE DIAGNOSIS:  Recurrent small bowel obstruction at  jejunojejunostomy status post laparoscopic Roux-en-Y gastric bypass.   POSTOPERATIVE DIAGNOSIS:  Recurrent small bowel obstruction at  jejunojejunostomy status post laparoscopic Roux-en-Y gastric bypass.   SURGICAL PROCEDURES:  Laparotomy with resection of previous  jejunojejunostomies x2 and small bowel anastomosis x2.   SURGEON:  Dr. Johna Sheriff.   ASSISTANT:  Leonie Man, MD.   ANESTHESIA:  General.   BRIEF HISTORY:  [name] is a 45 year old female who is about 2 weeks  following laparoscopic Roux-en-Y gastric bypass.  Postoperatively she  developed an obstruction of her Roux limb at the jejunojejunostomy and  about 5 days ago underwent laparoscopic jejunojejunostomy to bypass this  area of blockage.  She, however, has now developed recurrent or  persistent obstruction in the area of the jejunojejunostomy with a  somewhat worsening clinical situation over the last 24 hours.  CT scan  has now shown obstruction of the Roux limb as well as the  biliopancreatic limb, which appears to occur in the area of her  jejunojejunal anastomoses.  This failed to respond to bowel rest,  placement of an NG tube into the inferior limb, with increasing pain and  tenderness.  I therefore recommended proceeding with exploratory  laparotomy and probable resection of the previous jejunojejunostomies  with creation of 2 anastomoses.  This has been discussed with the  patient and her family, including the indications for procedure and the  risks of bleeding, infection, further leak or  blockage, and anesthetic  complications.  She is now brought to the operating room for this  procedure.   DESCRIPTION OF OPERATION:  The patient was brought to the operating room  and placed in the supine position on the operating table and general  endotracheal anesthesia was induced.  The abdomen widely sterilely  prepped and draped.  She received preoperative IV antibiotics.  PAS were  placed.  She was on subcutaneous heparin.  The correct patient and  procedure were verified.  The abdomen was explored through a midline  incision from adjacent to the umbilicus up into the epigastrium.  Dissection was carried down through the subcutaneous tissue and midline  fascia using cautery.  The peritoneum was entered under direct vision.  On entering the peritoneal cavity there was a moderate amount of  slightly cloudy straw-colored ascites that was suctioned.  There were  inflammatory adhesions of the omentum over the previous small bowel  anastomoses and this was carefully dissected off.  As I dissected this  off with careful blunt dissection over the more distal or second  anastomosis, there was an area of fairly significant inflammation  purulence and as the omentum was dissected off of the anastomosis  itself, there  appeared to be a contained leak of the anastomosis which  had been walled off by the omentum.  There was a small amount of  purulent material and bilious fluid that was suctioned and the omentum  was further dissected completely off of the small bowel to allow full  exploration.  It appeared that the second jejunojejunostomy a few  centimeters distal on the common channel to the original one again had  had disruption of the suture line with a contained leak.  The staple  lines appeared intact.  The more proximal jejunojejunostomy showed no  sign of leak.  As planned, I then proceeded with resection of both of  these anastomoses.  Using the GIA stapler, the bowel was divided as   close to the anastomoses as possible, getting back to relatively healthy-  appearing bowel.  This was performed on the Roux limb just proximal to  the anastomoses on the common channel just distal to the anastomoses and  on the biliopancreatic limb just proximal to the anastomoses.  The  biliary pancreatic limb was dilated but not tensely and not inflamed.  The common channel limb was completely decompressed.  It appeared  normal.  The obstructed Roux limb was somewhat thickened, edematous and  injected but certainly not ischemic.  After division of the 3 limbs of  bowel, again, very close to the anastomoses, the mesentery was divided  with the LigaSure device, staying very close to the bowel wall to  prevent any ischemia to the remaining limbs of bowel and this entire  area was resected.  At this point I could palpate the original  gastrojejunostomy and the afferent limb was widely patent but there was  a stricture and narrowing at the junction of the Roux limb to the  jejunojejunostomy.  This was sent for permanent pathology.  The abdomen  was then thoroughly irrigated with warm saline.  Prior to performing the  anastomoses, I elected to place a gastrostomy tube in the gastric  remnant for decompression.  I found this actually could be exposed best  just to the right of the Roux limb.  Of note, at this point also all  limbs of the bowel were traced completely, tracing the Roux limb back up  to the gastrojejunostomy, which appeared normal, and there were no kinks  or obstruction.  The common distal bowel was traced all the down to the  ileocecal valve and was entirely decompressed and normal.  The  biliopancreatic limb was normal up to the ligament of Treitz, this being  moderately dilated.  Again, the gastric remnant was most accessible just  to the right of the Roux limb in the distal body of the stomach and the  stomach was exposed and 2 concentric 2-0 chromic pursestring sutures  were  placed.  A 16-French Foley was brought in through one of the  previous trocar sites in the upper right abdomen.  Gastrotomy was made,  bilious material suctioned, the tube inserted and the balloon inflated.  The pursestring sutures were secured.  The stomach was then sutured up  to the anterior chest wall with 4 interrupted 2-0 silk sutures.  Attention was then turned to the anastomoses.  Initially I performed an  anastomosis between the previous distal common channel and the Roux  limb.  This was done in a side-to-side fashion with a single firing of  the GIA 75-mm stapler.  The staple line was intact and without bleeding.  The common enterotomy was then  closed in 2 layers with running 2-0  chromic begun at either end and tied centrally and then with an outer  layer of seromuscular interrupted 2-0 silk.  A reinforcement stitch was  placed in the crotch of the anastomosis.  This anastomosis appeared  widely patent, under no tension, with very good blood supply to the  limbs of bowel.  I then went about 10 cm distal to this anastomosis to  connect the biliopancreatic limb.  This, again, was done in a side-to-  side fashion with a firing of the GIA 75-mm stapler, and the common  enterotomy was closed in an identical fashion.  Great care was taken to  leave plenty of adequate lumen through the Roux and common channel.  There did not appear to be any narrowing of this.  The crotch of the  anastomosis was reinforced with interrupted silk and this also took any  kinking out of the anastomosis so it lay very smoothly.  Again, there  did not appear to be any tension.  There was very good blood supply and  the anastomosis appeared widely patent.  The abdomen was then thoroughly  irrigated with saline and hemostasis assured.  The omentum was brought  down and laid over the small bowel anastomoses.  The midline fascia was  then closed with running looped #1 PDS begun at either end of the  incision  and tied centrally.  The subcutaneous tissue was irrigated and  the skin closed with staples.  Sponge, needle and instrument counts were  correct.  The patient was taken to the recovery room in stable  condition.      Lorne Skeens. Hoxworth, M.D.  Electronically Signed     BTH/MEDQ  D:  04/26/2007  T:  04/26/2007  Job:  [id number]

## 2010-05-17 NOTE — Op Note (Signed)
NAMESOMMER, SPICKARD                 ACCOUNT NO.:  [id number]   MEDICAL RECORD NO.:  [id number]          PATIENT TYPE:  INP   LOCATION:  1534                         FACILITY:  [facility]   PHYSICIAN:  Sandria Bales. Ezzard Standing, M.D.  DATE OF BIRTH:  1965-02-15   DATE OF PROCEDURE:  04/09/2007  DATE OF DISCHARGE:                               OPERATIVE REPORT   Operative date here?   PREOPERATIVE DIAGNOSIS:  Morbid obesity (weight 198, BMI 36.5)   POSTOPERATIVE DIAGNOSIS:  Morbid obesity (weight 198, BMI 36.5).   PROCEDURE:  Laparoscopically Roux-en-Y gastric bypass (antecolic,  antegastric), dividing omentum, upper endoscopy.   SURGEON:  Sandria Bales. Ezzard Standing, M.D.   FIRST ASSISTANT:  Sharlet Salina T. Hoxworth, M.D.   ANESTHESIA:  General endotracheal with approximately 3 mL of 1/4%  Marcaine.   INDICATIONS FOR PROCEDURE:  Ms. Gilles  is a 45 year old white female  patient of Dr. Maurice Small and Dr. Dorisann Frames who I evaluated for  bariatric surgery.  She had been through our preop bariatric program,  and now comes for attempted laparoscopic Roux-en-Y gastric bypass.   The indications and potential complications of bypass surgery was  explained to the patient.  The potential complications include, but not  limited to, bleeding, infection, bowel injury/leak, deep venous  thrombosis, pulmonary embolism, and long-term nutritional consequences.   OPERATIVE NOTE:  The patient placed in the supine position and given a  general endotracheal anesthetic.  She had a Foley catheter placed, PAS  stockings, and was given 2 grams of cefoxitin before this procedure.  The abdomen was prepped and sterilely draped.  It was prepped with  Techni-Care.  A time-out was held identifying the patient and the  procedure.   I accessed her abdominal cavity through the left lower quadrant with a  12-mm OptiVu trocar.  I then placed 6 additional trocars; a 5 mm  subxiphoid trocar for liver retractor, a 12 mm right  subcostal trocar, a  12 mm right paramedian trocar, a 12 mm left paramedian trocar, a 5 mm  left upper quadrant trocar, and an 11 mm trocar to the right of the  umbilicus.  Initially abdominal exploration carried out.  The patient  had a single adhesive band to her anterior abdominal wall, etiology of  this band adhesion was unclear.  Her right and left lobes of her liver  unremarkable.  Her stomach was unremarkable; bowel, that I could see,  was unremarkable.  I then found the ligament of Treitz and counted 40 cm  to the proximal jejunum and divided this with a 45 mm white load of the  Endo-GIA stapler..   I then marked the future gastric limb with a Penrose drain.  I divided  the mesentery with the harmonic scalpel.  I then counted 100 cm for the  future gastric limb.  I then did a side-to-side enteroenterotomy.  I  used a 45-mm white load of the Ethicon Endo GIA stapler. I then closed  the enterotomy with two running 2-0 Vicryl sutures.  After this had been  completed, I inspected the anastomosis and saw  no leak.  I then closed  the jejunojejunal mesenteric defect with a running 2-0 silk suture.  I  then placed Tisseel over the jejunojejunostomy.  I then divided her  omentum up to the transverse colon.   I then placed the patient in a reverse Trendelenburg position with her  head up.  I got up and opened up the angle of His. I then went along the  lesser curve of the stomach, for approximately 4-5 cm, and got to the  lesser sac.  I divided the stomach first with a 45-mm blue load of the  Endo GI stapler.  I then did two firings of the 60-mm Ethicon blue  stapler, and then another single firing of the 45 blue stapler to create  a pouch approximately 5 cm in length and 3 cm in width.   I closed the other side of the gastric remnant with a locking running 2-  0 Vicryl suture.  I then brought the jejunum or future gastric limb,  antecolic, antegastric and put a posterior row of a  running 2-0 Vicryl  suture.  I then put an enterotomy in the stomach, and enterotomy in the  small bowel.  I used a 45 mm Ethicon blue load of staples to make a  gastrojejunostomy.  I then closed the enterotomy with two running 2-0  Vicryl sutures, and a single buttressing suture anteriorly.   I then placed the Ewald tube through the anastomosis, and this was  obviously widely patent.  I then put a second row anteriorly of the 2-0  Vicryl suture in a running fashion with a lapper towel in both ends.   I went down and I did put a single stitch trying to close Peterson's  defect between the transverse colon tenia and the mesentery of the small  bowel that goes to the stomach; and the omentum, also, that had been  divided kind of fell into place to cover this area.   I inspected the jejunojejunostomy, and this looked good.  I inspected  the gastrojejunostomy.  When this was done Dr. Johna Sheriff broke scrub.  He  went and did an upper endoscopy.  He identified the esophagogastric  junction about 39 cm, the gastrojejunostomy anastomosis at about 43 cm  for about a 4-5 cm pouch.  He saw minimal blood in the stomach, and no  active bleeding.  He insufflated the abdomen with air, while I kept the  stomach under saline.  We saw no leak, we could see the anastomosis of  the bowel, the bowel looked viable.  The scope was then withdrawn.   I then placed Tisseel over the gastrojejunostomy and into the jejunum up  in the upper stomach. I then removed the liver retractor, and each  trocar in turn; I infiltrated each port site with 1/4% Marcaine using  approximately 30 mL.  The skin was stapled.   The sponge and needle count were reported correct at the end of the  case.  The patient tolerated the procedure, and was transferred to the  recovery room in good condition.      Sandria Bales. Ezzard Standing, M.D.  Electronically Signed     DHN/MEDQ  D:  04/09/2007  T:  04/09/2007  Job:  [id number]   cc:   Gretta Arab.  Valentina Lucks, M.D.  Fax: [phone]   Dorisann Frames, M.D.  Fax: [phone]

## 2010-05-17 NOTE — Op Note (Signed)
NAMEKRYSTA, [name]                 ACCOUNT NO.:  [id number]   MEDICAL RECORD NO.:  [id number]          PATIENT TYPE:  AMB   LOCATION:  SDC                           FACILITY:  [facility]   PHYSICIAN:  Guy Sandifer. Henderson Cloud, M.D. DATE OF BIRTH:  [birth year]   DATE OF PROCEDURE:  05/23/2006  DATE OF DISCHARGE:                               OPERATIVE REPORT   PREOPERATIVE DIAGNOSIS:  Menorrhagia.   POSTOPERATIVE DIAGNOSIS:  Menorrhagia.   PROCEDURE:  NovaSure endometrial ablation, hysteroscopy, dilatation and  curettage and 1% Xylocaine paracervical block.   SURGEON:  Harold Hedge, M.D.   ANESTHESIA:  General with LMA.   SPECIMENS:  Endometrial curettings to pathology.   I&O of distending media 120 mL deficit.   INDICATIONS AND CONSENT:  This patient is a 45 year old white female  with increasingly severe menorrhagia.  After discussion of the options,  she is being admitted for hysteroscopy D&C, NovaSure endometrial  ablation.  Potential risks and complications have been discussed  preoperatively including but not limited to infection, uterine  perforation, organ damage, bleeding requiring transfusion of blood  products with possible transfusion reaction, HIV and hepatitis  acquisition, DVT, PE, pneumonia.  Success and failure rates of the  ablation have also been reviewed.  All questions were answered, and  consent is signed on the chart.   FINDINGS:  The uterine cavity is without abnormal structures.  The  fallopian tube ostia are identified bilaterally.   PROCEDURE:  The patient was taken to operating room where she was  identified, placed in dorsal supine position and general anesthesia was  induced via LMA.  She was then placed in a dorsal lithotomy position  where she was gently prepped, bladder straight catheterized.  She was  draped in a sterile fashion.  Bivalve speculum was placed in vagina.  The anterior cervical lip was injected with 1% Xylocaine and grasped  with a  single-tooth tenaculum.  Paracervical block was placed at 2, 4,  5, 7, 8 and 10 o'clock positions with approximately 20 mL total of 1%  plain Xylocaine.  Uterus sounds to 11 cm.  The cervical canal sounds to  5 cm.  The cervix was then gently progressively dilated to a 31 dilator.  Diagnostic hysteroscope was placed in the endocervical canal and  advanced under direct visualization using the distending media.  The  above findings were noted.  Hysteroscope was withdrawn.  Sharp curettage  was carried out.  The NovaSure endometrial ablation device was then  placed in the endometrial cavity.  Cavity width of 4.5 cm was noted.  The cavity test was passed on the first attempt.  Ablation was then  carried out with the machine automatically cutting off in the standard  fashion.  The NovaSure device was withdrawn.  Inspection revealed it to  be intact.  The hysteroscope was reintroduced in the endocervical canal,  and inspection  revealed good evidence of ablation throughout the canal and in the  fornices.  There was no evidence of perforation.  All instruments were  removed.  All counts correct.  The patient was awakened  and taken to the  recovery room in stable condition.      Guy Sandifer Henderson Cloud, M.D.  Electronically Signed     JET/MEDQ  D:  05/23/2006  T:  05/23/2006  Job:  [id number]

## 2010-05-20 NOTE — Op Note (Signed)
[facility]  Patient:    [name], [name]                        MRN: [id number] Proc. Date: 06/27/99 Adm. Date:  [id number] Disc. Date: [id number] Attending:  Lauree Chandler CC:         [name]. Dareen Piano, M.D.                           Operative Report  PREOPERATIVE DIAGNOSIS:  Distal left ureteral calculus.  POSTOPERATIVE DIAGNOSIS:  Distal left ureteral calculus.  OPERATION PERFORMED:  Cystoscopy, left ureteroscopy and stone manipulation.  SURGEON:  Maretta Bees. Vonita Moss, M.D.  ANESTHESIA:  General.  INDICATIONS FOR PROCEDURE:  This lady has never had stones before but has had two days of severe left flank pain and requested endoscopic intervention at this time.  See my consultation note for additional details.  DESCRIPTION OF PROCEDURE:  The patient was brought to the operating room and placed in lithotomy position.  The external genitalia were prepped and draped in the usual fashion.  She was cystoscoped and the bladder was unremarkable except for the left ureteral orifice.  In that area I could see a tiny part of the stone in the intramural ureter.  I inserted a guide wire M up the ureter and beyond the stone under visual in fluoroscopic control.  I then removed the cystoscope and inserted a 6.4 French short rigid ureteroscope and visualized the stone within the intramural ureter.  It was yellow and very irregular.  I was able to pull the stone out of the intramural ureter and into the bladder with a stone basket.  I then reinspected her ureter with a ureteroscope with the guide wire in place and there was no mucosal injury to the ureter which would necessitate a double-J catheter at this point so I removed the ureteroscope, I drained the bladder and removed the guide wire and sent her to the recovery room in good condition having tolerated the procedure well.  The stone was given to the patient.  It will be analyzed at a later date. DD:   06/27/99 TD:  06/29/99 Job: [id number]

## 2010-05-20 NOTE — Op Note (Signed)
[name], [name]                 ACCOUNT NO.:  [id number]   MEDICAL RECORD NO.:  [id number]          PATIENT TYPE:  AMB   LOCATION:  DAY                          FACILITY:  [facility]   PHYSICIAN:  Excell Seltzer. Annabell Howells, M.D.    DATE OF BIRTH:  September 27, 1965   DATE OF PROCEDURE:  08/01/2005  DATE OF DISCHARGE:                                 OPERATIVE REPORT   PROCEDURE:  Cystoscopy, left retrograde pyelogram with interpretation, and  left ureteroscopic stone extraction.   PREOPERATIVE DIAGNOSIS:  Left distal ureteral stone.   POSTOPERATIVE DIAGNOSIS:  Left distal ureteral stone.   SURGEON:  Dr. Bjorn Pippin.   ANESTHESIA:  General.   SPECIMEN:  Stones, which were sent with the patient.   COMPLICATIONS:  None.   INDICATIONS:  [name] is a 45 year old white female with a symptomatic 4 x  8-mm left distal ureteral stone which has failed to respond to conservative  therapy.  She has elected ureteroscopic stone extraction.   FINDINGS AT PROCEDURE:  The patient was taken to the operating room where  general anesthetic was induced.  She was given 400 mg of Cipro IV, and B&O  suppository was placed once she was in the lithotomy position.  Her perineum  and genitalia were prepped with Betadine solution, and she was draped in the  usual sterile fashion.  Cystoscopy was performed using the 22-French scope  and 12 and 70-degree lenses.  Examination revealed a normal urethra.  The  bladder wall was smooth and pale without tumor, stones, or inflammation.  Ureteral orifices were unremarkable.   The left ureteral orifice was cannulated with a 5-French open-end catheter,  and contrast was instilled in the retrograde fashion.   Left retrograde pyelogram demonstrated a filling defect in the upper portion  of the distal ureter consistent with a stone seen on CT scan.  I had not  been able to see the stone on two KUBs.   After completion of the retrograde pyelogram, a 6-French short ureteroscope  was  passed up the left ureteral orifice without difficulty.  This was  advanced to the level of the stone.  The stone was then engaged with a  nitinol basket, and with the stone engaged, the ureteroscope was advanced  into the proximal ureter to provide distal dilation.  The stone was then  removed without difficulty.  There were actually two pieces of stone.  The  largest was removed first, followed by the smallest.  Reinspection of the  ureter after removal of the second portion revealed no residual fragments.   The ureteroscope was then removed.   The cystoscope sheath was reinserted, and the bladder was drained.  It was  felt that a stent was not required.  The patient was taken down from  lithotomy position.  Her anesthetic was reversed, and she was moved to the  recovery room in stable condition, and there were no complications.      Excell Seltzer. Annabell Howells, M.D.  Electronically Signed     JJW/MEDQ  D:  08/01/2005  T:  08/01/2005  Job:  [id number]  cc:   Gretta Arab. Valentina Lucks, M.D.  Fax: [phone]   Dorisann Frames, M.D.  Fax: [phone]

## 2010-05-20 NOTE — Consult Note (Signed)
[facility]  Patient:    [name], [name]                        MRN: [id number] Proc. Date: 06/27/99 Adm. Date:  [id number] Attending:  Osborn Coho CC:         [name]. Dareen Piano, M.D.                          Consultation Report  HISTORY:  I was asked by Dr. Dareen Piano to see this 45 year old white female who has had a few days of some vague abdominal pains, but had severe left abdominal pain for the last 36 hours.  She was seen at [facility] emergency area, where a CT scan showed a 3 x 5 mm stone at the left UVJ, with mild to moderate hydronephrosis.  There was also some calcification in the abdominal aorta.  The patient had severe pain and was given an option of medical management versus surgical intervention.  She chose the latter.  For that reason she was transferred to [facility] for cystoscopic surgery this afternoon.  She has never had a stone before.  She has had a remote history of a UTI.  MEDICATIONS:  Her only medication is 50 mg of Zoloft a day.  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  HABITS:  She does not drink alcohol, but she smokes about eight cigarettes a day.  FAMILY HISTORY:  Noncontributory.  REVIEW OF SYSTEMS:  Documented on her health history form.  She has had two C-sections, cholecystectomy, and anal fissure surgery before.  PHYSICAL EXAMINATION:  VITAL SIGNS:  Blood pressure 100/60, pulse 62, respiratory rate 25, temperature 97.7.  GENERAL:  She is alert and oriented.  SKIN:  Warm and dry.  Right now she is in minimal discomfort.  LUNGS:  No respiratory distress.  HEART:  Heart tones regular.  ABDOMEN:  Soft and nontender.  LABORATORY:  KUB shows a stone consistent with the CT scan, located in the vicinity of the distal left ureter.  PLAN:  She and her husband were counselled about stone manipulation and possible double-J catheter and postop hematuria.  We hope that she will  be able to go home today after her procedure and husband was given prescription for Demerol tablets to use p.r.n. pain and cephalexin to use 0.5 gram q.i.d. for two days. DD:  06/27/99 TD:  06/28/99 Job: [id number]

## 2010-05-20 NOTE — Op Note (Signed)
[facility]  Patient:    [name], [name]                        MRN: [id number] Proc. Date: 07/05/99 Adm. Date:  [id number] Attending:  Osborn Coho                           Operative Report  PREOPERATIVE DIAGNOSIS:       Menometrorrhagia.  POSTOPERATIVE DIAGNOSIS:      Menometrorrhagia.  OPERATION:                    Hysteroscopy.  Dilatation and curettage.  SURGEON:                      Mark E. Dareen Piano, M.D.  ASSISTANT:  ANESTHESIA:                   MAC with paracervical block.  DRAINS:                       None.  ANTIBIOTICS:                  Ancef 1 gram.  SPECIMEN:                     Endometrial and endocervical curettings sent to pathology.  ESTIMATED BLOOD LOSS:         Minimal.  COMPLICATIONS:                None.  DESCRIPTION OF PROCEDURE:     The patient was taken to the operating room where she was placed in the dorsal lithotomy position.  She was prepped with Hibiclens and draped in the usual fashion for this procedure.  A MAC anesthesia was administered. A sterile speculum was placed in the vagina.  20 cc of 1% Nesocaine was used for paracervical block.  The cervix was then serially dilated to a 19 [place].  The hysteroscope was passed into the endocervical canal which all appeared to be normal. On entering the endometrial canal, both ostia were visualized and appeared to be normal.  There was no evidence of any submucosal fibroids, endometrial polyps, or endometrial carcinoma.  The tissue appeared to be normal, however, thickened.  At this point, the hysteroscope was removed.  An endocervical curettage was performed followed by an endometrial curettage.  Copious amount of endometrial tissue was obtained.  At this point, the procedure was concluded.  The instruments were removed.  The patient was taken to the recovery room in stable condition. She will be discharged to home.  She will be sent home with  Anaprox double strength to take p.r.n.  She will follow up in the office in four weeks. DD:  07/05/99 TD:  07/05/99 Job: [id number]

## 2010-05-20 NOTE — H&P (Signed)
[name], [name]                 ACCOUNT NO.:  [id number]   MEDICAL RECORD NO.:  [id number]          PATIENT TYPE:  INP   LOCATION:  1845                         FACILITY:  [facility]   PHYSICIAN:  Barnetta Chapel, MDDATE OF BIRTH:  1965/05/28   DATE OF ADMISSION:  04/12/2006  DATE OF DISCHARGE:                              HISTORY & PHYSICAL   PRIMARY CARE PHYSICIAN:  Dr. Consuella Lose C. Valentina Lucks, M.D.   CHIEF COMPLAINT:  1. Chest pain.  2. Shortness of breath.   HISTORY OF PRESENTING COMPLAINT:  The patient is a 45 year old female,  obese with a 3-year history of diagnosed diabetes.  The patient takes  Metformin, as well as insulin for blood sugar control.  The patient  presented with 1 to 2 weeks' history of progressive shortness of breath,  worse on exertion.  She also reported orthopnea, leg edema but denied  paroxysmal nocturnal dyspnea.  She described having pressure-like  mediastinal chest pain that would radiate to the left upper arm.  The  pain was so severe this afternoon, that she had to call an ambulance.  On her way to the hospital, the ambulance staff gave her sublingual  nitro, and her symptoms improved significantly.  She still had some  chest pain.  The patient denied any fever, chills, headache, neck pain,  cough, GI symptoms or urinary symptoms.  She reported having tingling  and numbness of the hands, which may be secondary to diabetic peripheral  neuropathy.   PAST MEDICAL HISTORY:  1. Obesity.  2. Diabetes mellitus, insulin requiring.   MEDICATIONS:  Meds prior to admission includes:  1. Metformin 1000 mg twice daily.  2. Subcutaneous Lantus insulin 70 units q.h.s.  3. Subcutaneous Humalog, 33 units with each meal !   ALLERGIES:  To Xylocaine and Novocain.   SOCIAL HISTORY:  The patient is a widow.  She has two children.  She  denied alcohol, cigarettes or illicit drug use.   FAMILY HISTORY:  Significant for coronary artery disease, CABG,  diabetes,  hypertension and cancer.   REVIEW OF SYSTEMS:  Essentially as above.   PHYSICAL EXAMINATION:  VITAL SIGNS:  On admission, the temp is 97.4, the  blood pressure is 110/67, the heart rate is 90, and the respiratory rate  is 14 with O2 sat of 99%.  GENERAL CONDITION:  The patient is not in any respiratory distress.  HEENT:  No pallor, no jaundice.  Extraocular movements are intact.  NECK:  Supple, no bruits, JVD and no lymphadenopathy.  LUNGS:  Clear to auscultation.  CV:  S1, S2 with an ejection systolic murmur.  ABDOMEN:  Obese, soft and nontender, no organomegaly.  Bowel sounds are  present.  EXTREMITIES:  Revealed 1 to 2+ edema on the shin, both presently.  There  is no edema at the dorsum of the feet!   LABORATORY:  Done so far:  The sodium is 129, the potassium is 30.7.  Chloride is 105, BUN is 12, glucose is 88, creatinine is 0.6.  White  blood count is 6.8, hemoglobin of 13, hematocrit of 37.7, MCV of  87.7,  platelet count of 269.  The CO2 is 27. The CK total is 76.  CK-MB is  0.7.  The troponin is 0.02.  The EKG reveals a normal sinus rhythm.   IMPRESSION:  1. Chest pain.  2. Suspected acute coronary syndrome, likely unstable angina.  3. Shortness of breath.  4. Heart murmur.  5. Rule out aortic valve pathology.  6. Diabetes mellitus, insulin-requiring.  7. Obesity.   PLAN:  Will admit patient to telemetry floor.  Will get a cardiology  consult, as the patient will need a cardiac stress test.  Meanwhile, we  will just go ahead and start patient on subcutaneous Lovenox, EC  aspirin, lisinopril, metoprolol and nitro.  Will continue Metformin and  subcutaneous Lantus.  Will check Accu-Check a.c. and h.s.  Check HBA1c  and fasting lipid profile.  Further management will depend on hospital  course.  Will also get an echocardiogram.      Barnetta Chapel, MD  Electronically Signed     SIO/MEDQ  D:  04/12/2006  T:  04/12/2006  Job:  [id number]   cc:   Gretta Arab. Valentina Lucks,  M.D.

## 2010-05-20 NOTE — Discharge Summary (Signed)
NAMESHIRLINE, [name]                 ACCOUNT NO.:  [id number]   MEDICAL RECORD NO.:  [id number]          PATIENT TYPE:  INP   LOCATION:  3703                         FACILITY:  [facility]   PHYSICIAN:  Barnetta Chapel, MDDATE OF BIRTH:  1965-05-26   DATE OF ADMISSION:  04/12/2006  DATE OF DISCHARGE:  04/13/2006                               DISCHARGE SUMMARY   PRIMARY CARE Deneise Getty:  Gretta Arab. Valentina Lucks, M.D.   ADMITTING DIAGNOSES:  1. Chest pain.  2. Suspect acute coronary syndrome.  3. Shortness of breath.  4. Heart murmur.  5. Diabetes mellitus, insulin requiring.  6. Obesity.   DISCHARGE DIAGNOSES:  1. Chest pain, questionable secondary to angina versus musculoskeletal      pain.  2. Shortness of breath, resolved.  3. Heart murmur.  4. Diabetes mellitus, insulin requiring.  5. Obesity.  6. Dyslipidemia.   DISCHARGE MEDICATIONS:  1. Crestor 10 mg p.o. once daily.  2. EC aspirin 81 mg p.o. once daily.  3. Lisinopril 20 mg p.o. once daily.  4. Sublingual nitroglycerin 0.4 mg as needed.  5. Subcutaneous Lantus insulin 70 units at bedtime.  6. Subcutaneous Humalog 10 units before meals t.i.d.  7. Metformin 1000 mg p.o. twice daily.  8. Multivitamin 1 tablet p.o. once daily.   CONSULTATIONS DONE:  The patient was seen by Dr. Halina Andreas  cardiologist.  The patient had an adenosine Cardiolite.  It showed an  apical fixed defect (which is most likely secondary to breast tissue,  i.e., artifact).   IMAGING STUDIES AND PERTINENT LABORATORIES:  1. Echocardiogram.  The report is still pending.  The primary care      Bryann Mcnealy should please check the report of the echocardiogram.  2. Adenosine Cardiolite, which showed an ejection fraction of 66%.  It      also showed fixed defect at the apex, with questionable apical      thinning.  3. HbA1c was 5.9.   BRIEF HISTORY AND HOSPITAL COURSE:  Please refer to the H&P done by Dr.  Dartha Lodge on April 12, 2006.  The patient presented with  chest pain which  was radiating to the left upper extremity and progressive shortness of  breath, especially on exertion.  The patient was admitted to the  telemetry floor.  Cardiac enzymes done were negative.  D-dimer was 0.52.  A cardiology consult was called, and the patient was seen by Dr. Katrinka Blazing.  An adenosine Cardiolite was done (please see above for the result).  The  patient was managed with Nitro paste, aspirin, ACE inhibitor, beta  blocker, as well as subcutaneous Lovenox.  The patient is chest pain  free.  Fasting lipid profile done revealed a total cholesterol of 201,  LDL of 132.  The patient will be started on Crestor 10 mg p.o. once  daily.  She will also be discharged back home on EC aspirin as well as  an ACE inhibitor.  She will use nitro 0.4 mg p.r.n. for any recurrent  chest pain.  The patient has done well.  The vital signs are all stable.  She  will be discharged back home today to the care of the primary care  Kyrstyn Greear.   DISCHARGE PLANS:  1. Discharge the patient back home today.  2. Follow up with the primary care Irene Collings, Dr. Maurice Small, in 1      week.  3. Follow up with Dr. Katrinka Blazing, [facility], in about 1 week.  4. ADA diet.  5. Activity as tolerated.  6. The primary care Aaryav Hopfensperger should please pursue the report of the      echocardiogram.  7. The primary care Glady Ouderkirk should also monitor the patient's blood      sugars closely.      Barnetta Chapel, MD  Electronically Signed     SIO/MEDQ  D:  04/13/2006  T:  04/13/2006  Job:  [id number]   cc:   Gretta Arab. Valentina Lucks, M.D.  Lyn Records, M.D.

## 2010-05-20 NOTE — H&P (Signed)
[name], [name]                 ACCOUNT NO.:  [id number]   MEDICAL RECORD NO.:  [id number]          PATIENT TYPE:  OIB   LOCATION:  2899                         FACILITY:  [facility]   PHYSICIAN:  Sanjeev K. Deveshwar, M.D.DATE OF BIRTH:  1965-08-05   DATE OF ADMISSION:  05/13/2004  DATE OF DISCHARGE:                                HISTORY & PHYSICAL   CHIEF COMPLAINT:  This patient is here for cerebral angiogram to be  performed today to evaluate here cerebrovascular circulation.   HISTORY AND PHYSICAL:  This is a pleasant 45 year old female followed by Dr.  Maurice Small as her primary care physician.  Dr. Valentina Lucks referred the  patient to Dr. Porfirio Mylar Dohmeier who saw the patient on April 12, 2004 for a  history of headaches, vertigo, nausea and vomiting.  The patient had  transcranial Dopplers performed that showed increased velocity of the  anterior circulation with low velocity of the right vertebral artery.  An  MRI/MRA was performed on April 19, 2004 that showed a question of  obstruction of the right vertebral artery with questionable obstruction of  the right middle cerebral artery and right anterior cerebral artery.  There  were no obvious aneurysms noted.  The patient is here now for cerebral  angiogram to further evaluate these findings.   PAST MEDICAL HISTORY:  Significant for diabetes mellitus, hyperlipidemia,  gastroesophageal reflux disease, migraine headaches, vertigo, renal calculi,  history of mitral valve prolapse, question of obstructive sleep apnea,  chronic diarrhea of uncertain etiology.   PAST SURGICAL HISTORY:  Significant for cholecystectomy, status post anal  fissure repair, status post Cesarean sections X2, history of renal calculi  surgery.   ALLERGIES:  No known drug allergies.  The patient states she had some sort  of REACTION to XYLOCAINE once, however, on subsequent  dosages she had no  reaction.   CURRENT MEDICATIONS:  Include metformin b.i.d.,  dosage uncertain, however,  the patient states she is on a very low dose.  She is also on Zocor 10 mg  daily.  She previously was on __________ 10 mg b.i.d., however, she has not  been taking this recently.  She does not take aspirin.   SOCIAL HISTORY:  The patient is widowed.  She has two daughters, one 62 years  old, one 4 years old.  She lives in [place].  She quit smoking 13 years  ago.  She did smoke one-half pack per day for approximately 12 years.  She  uses alcohol rarely.  She works as an Print production planner.   FAMILY HISTORY:  Her mother is alive at age 59.  She has some thyroid  problems.  Her father is alive at age 21.  He has diabetes mellitus and  hypertension.  She has one sister, alive and well who has migraine  headaches.   REVIEW OF SYMPTOMS:  Completely negative except for the following:  She has  had recent headaches, dizziness, as well as blurred vision.  She has had  some lower extremity edema.  She has had dyspnea on exertion.  She believes  she has  had syncope with these vertigo episodes.  She has had nausea,  vomiting, diarrhea.  She has had diarrhea for over a year.  She has  occasional indigestion.  She has mild anxiety.  Her last menstrual period  was 2.5 weeks ago.  She has dysarthria, numbness and difficulty with her  coordination during these episodes of vertigo.   PHYSICAL EXAMINATION:  GENERAL APPEARANCE:  Reveals a pleasant 45 year old  white female in no acute distress.  VITAL SIGNS:  Blood pressure 123/85, pulse 85, weight 182 pounds,  temperature 98.3, oxygen saturation 96% on room air.  HEENT:  Unremarkable.  NECK:  Reveals no bruits, no jugular venous distention.  HEART:  Reveals regular rate and rhythm with question of a soft, systolic  murmur.  LUNGS:  Clear.  ABDOMEN:  Obese, soft, non tender.  EXTREMITIES:  Reveal pulses intact with trace edema.  NEUROLOGICAL:  Mental status- patient is alert and oriented, follows  commands, answers  questions appropriately.  Cranial nerves II-XII are  grossly intact.  Sensation is intact to light touch.  Motor strength is 5/5  throughout.  Reflexes are brisk.  Cerebellar testing is intact in all areas.  Airway is rated at a 3, ASA scale is a 1.   LABORATORY DATA:  Pending.   IMPRESSION:  1.  History of migraine headaches, vertigo and nausea.  2.  Abnormal transcranial Doppler's performed recently, please see above.  3.  History of MRI/MRA on April 19, 2004 with question of multiple areas of      obstruction.  4.  History of diabetes.  5.  History of hyperlipidemia.  6.  Gastroesophageal reflux disease.  7.  Questionable mitral valve prolapse.  8.  Question of obstructive sleep apnea.  9.  History of renal calculi.  10. Chronic diarrhea.  11. Status post multiple surgeries as noted above.  12. Questionable REACTION to XYLOCAINE.   PLAN:  As noted, this patient will undergo a cerebral angiogram to be  performed today for further evaluation of the above-noted symptoms.      DR/MEDQ  D:  05/13/2004  T:  05/13/2004  Job:  [id number]   cc:   Gretta Arab. Valentina Lucks, M.D.  [address]  [place] [address]  Fax: [phone]   Melvyn Novas, M.D.  [address]  [address]  [place]  [place] [address]  Fax: [phone]

## 2010-09-23 LAB — INFLUENZA A AND B ANTIGEN (CONVERTED LAB)
Inflenza A Ag: POSITIVE — AB
Influenza B Ag: POSITIVE — AB

## 2010-09-26 LAB — HEMOGLOBIN AND HEMATOCRIT, BLOOD
HCT: 41.1
Hemoglobin: 14.4

## 2010-09-26 LAB — BASIC METABOLIC PANEL
BUN: 11
GFR calc Af Amer: >60
GFR calc non Af Amer: >60
Potassium: 4.1
Sodium: 138

## 2010-09-27 LAB — CBC
HCT: 30.6 — ABNORMAL LOW
HCT: 31.1 — ABNORMAL LOW
HCT: 35.3 — ABNORMAL LOW
HCT: 38.1
HCT: 41.2
HCT: 42.3
Hemoglobin: 12.2
Hemoglobin: 10.6 — ABNORMAL LOW
Hemoglobin: 11.6 — ABNORMAL LOW
Hemoglobin: 13
Hemoglobin: 14.2
Hemoglobin: 9.7 — ABNORMAL LOW
MCHC: 35.9
MCHC: 35.9
MCHC: 33.9
MCHC: 34.3
MCHC: 34.6
MCHC: 34.7
MCHC: 35
MCV: 89.1
MCV: 89.8
MCV: 90
MCV: 90
MCV: 90.3
MCV: 90.5
MCV: 91
MCV: 91.5
Platelets: 186
Platelets: 157
Platelets: 191
Platelets: 192
Platelets: 196
Platelets: 237
Platelets: 240
RBC: 3.69 — ABNORMAL LOW
RBC: 3.72 — ABNORMAL LOW
RBC: 3.81 — ABNORMAL LOW
RBC: 3.92
RBC: 4.14
RBC: 4.58
RBC: 4.63
RDW: 13.1
RDW: 13.3
RDW: 13.8
RDW: 14.1
RDW: 14.2
RDW: 14.2
WBC: 5.1
WBC: 5.4
WBC: 15.2 — ABNORMAL HIGH
WBC: 2.5 — ABNORMAL LOW
WBC: 3.4 — ABNORMAL LOW
WBC: 3.6 — ABNORMAL LOW
WBC: 5.9
WBC: 6.6
WBC: 7

## 2010-09-27 LAB — DIFFERENTIAL
Basophils Absolute: 0
Basophils Absolute: 0
Basophils Absolute: 0
Basophils Absolute: 0
Basophils Absolute: 0
Basophils Absolute: 0
Basophils Relative: 0
Basophils Relative: 1
Basophils Relative: 0
Basophils Relative: 0
Basophils Relative: 0
Basophils Relative: 1
Basophils Relative: 1
Eosinophils Absolute: 0
Eosinophils Absolute: 0
Eosinophils Absolute: 0
Eosinophils Absolute: 0.1
Eosinophils Relative: 0
Eosinophils Relative: 1
Eosinophils Relative: 2
Lymphocytes Relative: 12
Lymphocytes Relative: 46
Lymphs Abs: 1.4
Lymphs Abs: 0.6 — ABNORMAL LOW
Lymphs Abs: 0.9
Lymphs Abs: 1.1
Lymphs Abs: 1.6
Monocytes Absolute: 0.5
Monocytes Absolute: 0.5
Monocytes Absolute: 0.6
Monocytes Absolute: 1.5 — ABNORMAL HIGH
Monocytes Relative: 7
Monocytes Relative: 10
Monocytes Relative: 5
Monocytes Relative: 7
Monocytes Relative: 8
Neutro Abs: 3.2
Neutro Abs: 5.1
Neutro Abs: 1.3 — ABNORMAL LOW
Neutro Abs: 12.7 — ABNORMAL HIGH
Neutro Abs: 4.5
Neutro Abs: 6.1
Neutrophils Relative %: 60
Neutrophils Relative %: 64
Neutrophils Relative %: 40 — ABNORMAL LOW
Neutrophils Relative %: 76
Neutrophils Relative %: 81 — ABNORMAL HIGH
Neutrophils Relative %: 84 — ABNORMAL HIGH
Neutrophils Relative %: 85 — ABNORMAL HIGH

## 2010-09-27 LAB — COMPREHENSIVE METABOLIC PANEL
ALT: 14
ALT: 17
ALT: 27
AST: 18
AST: 22
AST: 29
Albumin: 1.7 — ABNORMAL LOW
Albumin: 1.9 — ABNORMAL LOW
Albumin: 2.4 — ABNORMAL LOW
Albumin: 3.8
Alkaline Phosphatase: 140 — ABNORMAL HIGH
Alkaline Phosphatase: 53
Alkaline Phosphatase: 60
Alkaline Phosphatase: 78
BUN: 7
BUN: 7
BUN: <1 — ABNORMAL LOW
CO2: 25
Calcium: 7.8 — ABNORMAL LOW
Calcium: 8.2 — ABNORMAL LOW
Calcium: 8.4
Chloride: 103
Chloride: 103
Chloride: 106
Chloride: 107
Creatinine, Ser: 0.44
Creatinine, Ser: 0.52
GFR calc Af Amer: >60
GFR calc Af Amer: >60
GFR calc Af Amer: >60
Glucose, Bld: 129 — ABNORMAL HIGH
Glucose, Bld: 153 — ABNORMAL HIGH
Glucose, Bld: 223 — ABNORMAL HIGH
Glucose, Bld: 294 — ABNORMAL HIGH
Potassium: 3.4 — ABNORMAL LOW
Potassium: 3.7
Potassium: 3.9
Potassium: 4
Sodium: 133 — ABNORMAL LOW
Sodium: 135
Sodium: 138
Total Bilirubin: 1
Total Bilirubin: 1.7 — ABNORMAL HIGH
Total Bilirubin: 1.8 — ABNORMAL HIGH
Total Protein: 4.6 — ABNORMAL LOW
Total Protein: 5.2 — ABNORMAL LOW
Total Protein: 6.2

## 2010-09-27 LAB — BASIC METABOLIC PANEL
BUN: 2 — ABNORMAL LOW
BUN: 4 — ABNORMAL LOW
BUN: 6
CO2: 18 — ABNORMAL LOW
CO2: 22
CO2: 29
CO2: 31
Calcium: 7.7 — ABNORMAL LOW
Calcium: 8 — ABNORMAL LOW
Calcium: 8.3 — ABNORMAL LOW
Calcium: 8.7
Chloride: 105
Chloride: 110
Chloride: 110
Creatinine, Ser: 0.45
Creatinine, Ser: 0.5
Creatinine, Ser: 0.64
Creatinine, Ser: 0.75
GFR calc Af Amer: >60
GFR calc Af Amer: >60
GFR calc Af Amer: >60
GFR calc Af Amer: >60
GFR calc non Af Amer: >60
GFR calc non Af Amer: >60
Glucose, Bld: 116 — ABNORMAL HIGH
Glucose, Bld: 134 — ABNORMAL HIGH
Glucose, Bld: 137 — ABNORMAL HIGH
Potassium: 3.3 — ABNORMAL LOW
Potassium: 3.8
Potassium: 4
Potassium: 4.2
Sodium: 136
Sodium: 138
Sodium: 139
Sodium: 141

## 2010-09-27 LAB — CROSSMATCH

## 2010-09-27 LAB — HEMOGLOBIN AND HEMATOCRIT, BLOOD: Hemoglobin: 12.5

## 2010-09-27 LAB — TRIGLYCERIDES: Triglycerides: 181 — ABNORMAL HIGH

## 2010-09-27 LAB — PHOSPHORUS
Phosphorus: 2.7
Phosphorus: 3.4

## 2010-09-27 LAB — PREALBUMIN: Prealbumin: 3.1 — ABNORMAL LOW

## 2010-09-27 LAB — ABO/RH: ABO/RH(D): O POS

## 2010-09-27 LAB — AMYLASE: Amylase: 52

## 2010-09-27 LAB — PREGNANCY, URINE: Preg Test, Ur: NEGATIVE

## 2010-09-28 LAB — DIFFERENTIAL
Basophils Absolute: 0
Basophils Absolute: 0.1
Basophils Relative: 0
Eosinophils Absolute: 0.2
Eosinophils Relative: 2
Eosinophils Relative: 3
Lymphocytes Relative: 29
Lymphs Abs: 1
Monocytes Absolute: 0.3
Monocytes Absolute: 0.5
Monocytes Relative: 10
Neutro Abs: 2.1
Neutro Abs: 4.5

## 2010-09-28 LAB — BASIC METABOLIC PANEL
BUN: 16
BUN: 17
CO2: 25
Chloride: 101
Chloride: 104
GFR calc Af Amer: >60
GFR calc non Af Amer: >60
GFR calc non Af Amer: >60
GFR calc non Af Amer: >60
Glucose, Bld: 123 — ABNORMAL HIGH
Glucose, Bld: 123 — ABNORMAL HIGH
Glucose, Bld: 132 — ABNORMAL HIGH
Potassium: 3.9
Potassium: 4
Potassium: 3.7
Sodium: 135
Sodium: 136
Sodium: 138

## 2010-09-28 LAB — COMPREHENSIVE METABOLIC PANEL
ALT: 11
ALT: 15
AST: 17
Albumin: 2 — ABNORMAL LOW
Albumin: 2.5 — ABNORMAL LOW
Alkaline Phosphatase: 153 — ABNORMAL HIGH
Alkaline Phosphatase: 66
BUN: 13
BUN: 13
BUN: 2 — ABNORMAL LOW
CO2: 26
Calcium: 8.6
Calcium: 8.9
Calcium: 9.3
Chloride: 103
Creatinine, Ser: 0.48
Creatinine, Ser: 0.48
Creatinine, Ser: 0.53
GFR calc Af Amer: >60
GFR calc non Af Amer: >60
Glucose, Bld: 127 — ABNORMAL HIGH
Glucose, Bld: 85
Potassium: 3.2 — ABNORMAL LOW
Potassium: 3.6
Potassium: 4
Sodium: 141
Sodium: 141
Total Bilirubin: 0.5
Total Protein: 5.7 — ABNORMAL LOW
Total Protein: 6
Total Protein: 6.6
Total Protein: 6.7

## 2010-09-28 LAB — PREALBUMIN
Prealbumin: 10.4 — ABNORMAL LOW
Prealbumin: 8.3 — ABNORMAL LOW
Prealbumin: 8.5 — ABNORMAL LOW

## 2010-09-28 LAB — MAGNESIUM
Magnesium: 1.9
Magnesium: 2.1
Magnesium: 2.2

## 2010-09-28 LAB — CHOLESTEROL, TOTAL
Cholesterol: 105
Cholesterol: 124

## 2010-09-28 LAB — CBC
HCT: 26.9 — ABNORMAL LOW
HCT: 27.6 — ABNORMAL LOW
HCT: 28.9 — ABNORMAL LOW
Hemoglobin: 9 — ABNORMAL LOW
Hemoglobin: 9.4 — ABNORMAL LOW
MCHC: 34.2
MCV: 89.6
MCV: 89.7
MCV: 90.4
Platelets: 245
Platelets: 290
Platelets: 300
RBC: 2.9 — ABNORMAL LOW
RBC: 3 — ABNORMAL LOW
RDW: 13.3
RDW: 13.9
RDW: 14.1
WBC: 6.2

## 2010-09-28 LAB — TRIGLYCERIDES: Triglycerides: 170 — ABNORMAL HIGH

## 2010-09-28 LAB — PHOSPHORUS
Phosphorus: 3.4
Phosphorus: 4.7 — ABNORMAL HIGH

## 2011-02-01 ENCOUNTER — Telehealth (INDEPENDENT_AMBULATORY_CARE_PROVIDER_SITE_OTHER): Payer: Self-pay

## 2011-02-01 NOTE — Telephone Encounter (Signed)
Pt called c/o abd pain. Pt states she had migraine headache yesterday and had vomiting with headache. Pt states it is not unusual for her to vomit with her headaches. Pt states she has had continued abd pain since. Pt worse with food or liquids. I reviewed this with Dr Ezzard Standing. Pt advised per Dr Ezzard Standing stay on clear liquids 24 hours and if pain does not resolve or becomes worse pt is to go to ER. If pt is improving pt is to call [facility] tomorrow and give update on her symptoms.

## 2012-01-04 ENCOUNTER — Telehealth (INDEPENDENT_AMBULATORY_CARE_PROVIDER_SITE_OTHER): Payer: Self-pay | Admitting: Surgery

## 2012-01-04 NOTE — Telephone Encounter (Signed)
11/29/11 mailed recall letter for bariatric surgery follow-up to patient. Advised pt to call [facility] at [phone] to °schedule appt. (lss) ° °

## 2012-03-13 ENCOUNTER — Emergency Department (HOSPITAL_COMMUNITY)
Admission: EM | Admit: 2012-03-13 | Discharge: 2012-03-13 | Discharge status: Against Medical Advice | Payer: Self-pay | Attending: Emergency Medicine | Admitting: Emergency Medicine

## 2012-03-13 ENCOUNTER — Encounter (HOSPITAL_COMMUNITY): Payer: Self-pay | Admitting: Emergency Medicine

## 2012-03-13 DIAGNOSIS — R21 Rash and other nonspecific skin eruption: Secondary | ICD-10-CM | POA: Insufficient documentation

## 2012-03-13 DIAGNOSIS — Z9889 Other specified postprocedural states: Secondary | ICD-10-CM | POA: Insufficient documentation

## 2012-03-13 DIAGNOSIS — F17200 Nicotine dependence, unspecified, uncomplicated: Secondary | ICD-10-CM | POA: Insufficient documentation

## 2012-03-13 HISTORY — DX: Unspecified coma: R40.20

## 2012-03-13 HISTORY — DX: Unspecified complication of procedure, initial encounter: T81.9XXA

## 2012-03-13 NOTE — ED Notes (Signed)
Pt states that she has not been feeling well for the past few months.  States that she started having dry patches on her legs months ago.  States that she had what she thought was an ingrown hair on the end of her nose, she said she would pick at it, it would go away and then come back.  Pt stated that she took tweezers to it, and found that whatever it was that she pulled out was alive.  States that she went to urgent care today and was told that she has parasites in her eyes, nose, mouth and skin and was told to come to the ER because they could not deal with it there.

## 2012-03-13 NOTE — ED Notes (Signed)
Pt became disgruntled as PA spoke with her and told her that she would refer her to a dermatologist. Pt left AMA.

## 2012-03-13 NOTE — ED Provider Notes (Signed)
History     CSN: [id number]  Arrival date & time 03/13/12  1300   First MD Initiated Contact with Patient 03/13/12 1538      Chief Complaint  Patient presents with  . Parasite   . Sent By Urgent Care     (Consider location/radiation/quality/duration/timing/severity/associated sxs/prior treatment) HPI Comments: [name] is a 47 y.o. female with a history of gastric bypass surgery presents emergency department complaining that she has parasites in her eyes, nose, mouth, and skin.  Previous nursing note that patient reported she was sent here from urgent care, however there is no urgent care note seen.  Per patient her symptoms began months ago with dry patches on her legs that she was treating with lotion and then a couple months ago she saw something moving in her abdomen that she believed to be a parasite.  Over the last couple of weeks she has been taking at her ear, nose and face and finding white "alive bugs."  Patient denies any recent travel, sick contacts, people with similar presentation, fevers, night sweats, chills, pruritus vision change.  The history is provided by the patient.    Past Medical History  Diagnosis Date  . Coma   . Post-operative complication     Past Surgical History  Procedure Laterality Date  . Gastric bypass      History reviewed. No pertinent family history.  History  Substance Use Topics  . Smoking status: Current Some Day Smoker  . Smokeless tobacco: Not on file  . Alcohol Use: No    OB History   Grav Para Term Preterm Abortions TAB SAB Ect Mult Living                  Review of Systems  Constitutional: Negative for fever, diaphoresis and activity change.  HENT: Negative for congestion and neck pain.   Respiratory: Negative for cough.   Genitourinary: Negative for dysuria.  Musculoskeletal: Negative for myalgias.  Skin: Positive for rash. Negative for color change and wound.  Neurological: Negative for headaches.  All other  systems reviewed and are negative.    Allergies  Review of patient's allergies indicates no known allergies.  Home Medications   Current Outpatient Rx  Name  Route  Sig  Dispense  Refill  . amphetamine-dextroamphetamine (ADDERALL) 20 MG tablet   Oral   Take 10-20 mg by mouth 2 (two) times daily. Takes 1 tablet(20mg ) every morning and 1/2 tablet(10mg ) every evening         . Multiple Vitamin (MULTIVITAMIN WITH MINERALS) TABS   Oral   Take 1 tablet by mouth daily.           BP 145/100  Pulse 97  Temp(Src) 98.9 F (37.2 C) (Oral)  Resp 16  SpO2 100%  Physical Exam  Nursing note and vitals reviewed. Constitutional: She is oriented to person, place, and time. She appears well-developed and well-nourished. She appears distressed.  Actively picking skin  HENT:  Head: Normocephalic and atraumatic.  Small superficial non bleeding intranasal laceration, right nare. Normal external ear canals and TM bilaterally.   Eyes: Conjunctivae and EOM are normal.  Neck: Normal range of motion.  Pulmonary/Chest: Effort normal.  Musculoskeletal: Normal range of motion.  Neurological: She is alert and oriented to person, place, and time.  Skin: Skin is warm and dry. No rash noted. She is not diaphoretic.  Small red scabs c/w skin picking   Psychiatric: Her behavior is normal. Her mood appears anxious. Her  speech is rapid and/or pressured.    ED Course  Procedures (including critical care time)  Labs Reviewed - No data to display No results found.   No diagnosis found. During physical exam patient pulled mucous out from her nose stating it was a parasite, but that this one must be dead because it is not moving. Explained to pt that I did not believe this to be a parasite and suggested if no physical exam findings were found that I would likely refer her to dermatology at which time she became very upset and stood up to leave. I explained that I still wanted to do a thorough eye exam  I  offered her a second opinion from my attending at which time she grabbed her purse and left AMA  MDM  AMA        Jaci Carrel, PA-C 03/13/12 1642

## 2012-03-13 NOTE — ED Provider Notes (Signed)
Medical screening examination/treatment/procedure(s) were performed by non-physician practitioner and as supervising physician I was immediately available for consultation/collaboration.  Raeford Razor, MD 03/13/12 2111

## 2012-03-15 ENCOUNTER — Emergency Department (HOSPITAL_BASED_OUTPATIENT_CLINIC_OR_DEPARTMENT_OTHER)
Admission: EM | Admit: 2012-03-15 | Discharge: 2012-03-16 | Disposition: A | Discharge status: Home / Self Care | Payer: Self-pay | Attending: Emergency Medicine | Admitting: Emergency Medicine

## 2012-03-15 ENCOUNTER — Encounter (HOSPITAL_BASED_OUTPATIENT_CLINIC_OR_DEPARTMENT_OTHER): Payer: Self-pay | Admitting: Emergency Medicine

## 2012-03-15 DIAGNOSIS — L851 Acquired keratosis [keratoderma] palmaris et plantaris: Secondary | ICD-10-CM | POA: Insufficient documentation

## 2012-03-15 DIAGNOSIS — L259 Unspecified contact dermatitis, unspecified cause: Secondary | ICD-10-CM | POA: Insufficient documentation

## 2012-03-15 DIAGNOSIS — F17200 Nicotine dependence, unspecified, uncomplicated: Secondary | ICD-10-CM | POA: Insufficient documentation

## 2012-03-15 DIAGNOSIS — L299 Pruritus, unspecified: Secondary | ICD-10-CM | POA: Insufficient documentation

## 2012-03-15 DIAGNOSIS — L853 Xerosis cutis: Secondary | ICD-10-CM

## 2012-03-15 NOTE — ED Notes (Signed)
Pt states that she saw dr. Cathey Endow today and that she contacted [facility] and patient states she was told they would be in touch with her, pt is figity in triage, talkative, very upset stating she feels like everyone thinks she is crazy. Pt stated that she has been congested for several days, today she blew her nose and blood came out, then something moved in her nose and went down back of her throat and now she can breath

## 2012-03-15 NOTE — ED Notes (Signed)
Pt feels like she has parasite in her skin and that if you turn the lights off they come out

## 2012-03-16 ENCOUNTER — Encounter (HOSPITAL_COMMUNITY): Payer: Self-pay | Admitting: Emergency Medicine

## 2012-03-16 ENCOUNTER — Emergency Department (HOSPITAL_COMMUNITY)
Admission: EM | Admit: 2012-03-16 | Discharge: 2012-03-16 | Disposition: A | Discharge status: Home / Self Care | Payer: Self-pay | Attending: Emergency Medicine | Admitting: Emergency Medicine

## 2012-03-16 DIAGNOSIS — L988 Other specified disorders of the skin and subcutaneous tissue: Secondary | ICD-10-CM

## 2012-03-16 DIAGNOSIS — L989 Disorder of the skin and subcutaneous tissue, unspecified: Secondary | ICD-10-CM | POA: Insufficient documentation

## 2012-03-16 DIAGNOSIS — F17200 Nicotine dependence, unspecified, uncomplicated: Secondary | ICD-10-CM | POA: Insufficient documentation

## 2012-03-16 DIAGNOSIS — Z79899 Other long term (current) drug therapy: Secondary | ICD-10-CM | POA: Insufficient documentation

## 2012-03-16 NOTE — ED Provider Notes (Signed)
History     CSN: [id number]  Arrival date & time 03/15/12  2312   First MD Initiated Contact with Patient 03/16/12 0125      Chief Complaint  Patient presents with  . Rash    (Consider location/radiation/quality/duration/timing/severity/associated sxs/prior treatment) Patient is a 47 y.o. female presenting with rash. The history is provided by the patient.  Rash She has been having problems for about the last 4 months with and dry and itchy skin. The skin sometimes thick soft. She has noticed recently a PET some things come out of her skin at night and they seem to be moving. She also had what she thought was an ingrown hair in her nose and she would pull up but would keep recurring. At one time, she blew her nose and there was some bleeding and she put her finger to stop the bleeding and felt that something was moving. She has also seen what she thought was something moving in the corner of her eye and took videos of that. She's also taken pictures of what she thinks are the parasites coming out of her skin when it is dark. She did go to an emergency department and was told to see a dermatologist and she left. She is convinced that she has parasites. 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  Past Medical History  Diagnosis Date  . Coma   . Post-operative complication     Past Surgical History  Procedure Laterality Date  . Gastric bypass      History reviewed. No pertinent family history.  History  Substance Use Topics  . Smoking status: Current Some Day Smoker  . Smokeless tobacco: Not on file  . Alcohol Use: No    OB History   Grav Para Term Preterm Abortions TAB SAB Ect Mult Living                  Review of Systems  Skin: Positive for rash.  All other systems reviewed and are negative.    Allergies  Review of patient's allergies indicates no known allergies.  Home  Medications   Current Outpatient Rx  Name  Route  Sig  Dispense  Refill  . amphetamine-dextroamphetamine (ADDERALL) 20 MG tablet   Oral   Take 10-20 mg by mouth 2 (two) times daily. Takes 1 tablet(20mg ) every morning and 1/2 tablet(10mg ) every evening         . Multiple Vitamin (MULTIVITAMIN WITH MINERALS) TABS   Oral   Take 1 tablet by mouth daily.           BP 175/100  Pulse 99  Temp(Src) 98.1 F (36.7 C) (Oral)  Resp 18  Ht 5\' 2"  (1.575 m)  Wt 114 lb (51.71 kg)  BMI 20.85 kg/m2  SpO2 100%  Physical Exam  Nursing note and vitals reviewed.  47 year old female, resting comfortably and in no acute distress. Vital signs are significant for hypertension with blood pressure 175/100. Oxygen saturation is 100%, which is normal. Head is normocephalic and atraumatic. PERRLA, EOMI. Oropharynx is clear. Neck is nontender and supple without adenopathy or JVD. Back is nontender and there is no CVA tenderness. Lungs are clear without rales, wheezes, or rhonchi. Chest is nontender. Heart has regular rate and rhythm without murmur. Abdomen is soft, flat, nontender without masses or hepatosplenomegaly and peristalsis is normoactive. Extremities have no cyanosis or edema, full  range of motion is present. Skin is dry with some flaking but no evidence of actual rash or parasites. Neurologic: Mental status is normal, cranial nerves are intact, there are no motor or sensory deficits.  ED Course  Procedures (including critical care time)   1. Dry skin dermatitis       MDM  Skin complaints which really seem to be due to dry, flaking skin. Patient is convinced that she has parasites but I see no evidence of that. I discussed this with her. I recommended that she see a dermatologist for a second opinion. Old records are reviewed and apparently she had been seen in the emergency department a few days ago left AMA when she was told problem was just dry skin. Since she states that the  parasites come out when to start, the room was turned completely dark and then I shine the light in found no evidence of any       Dione Booze, MD 03/16/12 0145

## 2012-03-16 NOTE — ED Notes (Signed)
FAO:[id number]> Expected date:<BR> Expected time:<BR> Means of arrival:<BR> Comments:<BR> Causey

## 2012-03-16 NOTE — ED Notes (Signed)
Opened pts chart to assist in answering her questions regarding chief complaint.

## 2012-03-16 NOTE — ED Notes (Signed)
Patient contacted via phone, left message. Need another stool sample.

## 2012-03-16 NOTE — ED Notes (Signed)
Patient c/o parasites on skin x 1 month.  Patient reports parasites in nose, mouth, eyes, and stomach.  Patient c/o feeling things move in her stomach.  Patient also reports anxiety and sleep deprivation.  Patient denies fevers.  Patient states she has been seen here and at [facility] and the providers have referred her to a dermatologist and think she has anxiety.  Patient brought stool sample with her and reports dark red bloody stools.

## 2012-03-16 NOTE — ED Notes (Signed)
Opened chart to print labels for Charge RN

## 2012-03-16 NOTE — ED Notes (Signed)
Reports a parasite or something living in me that does not belong in me for 3 weeks. Stated the parasite was packed in my nose, going down my throat, pulled it out from nostril this was done on Wednesday 12th. This morning noted parasite in stool noted different shape, white pus, long something disgusting in stool (patient has brought in a cup of stool from home). Experienced nausea and vomiting for 2 days the last time was this am. Last meal was dinner around 2000hrs. Stated can feel a tighten, pulse-contraction  feeling  in her stomach. The inside of her stomach feels like it has flipped over.

## 2012-03-16 NOTE — ED Notes (Signed)
Patient frustrated that she is unable to show MD the worms/parasite. Nurse saw a white slender form in patient's medial eye that appeared for a few seconds then disappeared. Stool sent for culture.

## 2012-03-16 NOTE — ED Notes (Signed)
Opened chart to reorder O&P

## 2012-03-17 NOTE — ED Provider Notes (Signed)
History    47yF with cc of worms in nose, eyes, skin, stomach, etc. Third ER evaluation for same. Frustrated because feels like is not being taken seriously. No fever or chills. No travel outside [place] in past several months. No significant camping history of known consumption of contaminated food or water.   CSN: [id number]  Arrival date & time 03/16/12  1222   First MD Initiated Contact with Patient 03/16/12 1304      Chief Complaint  Patient presents with  . parasite     (Consider location/radiation/quality/duration/timing/severity/associated sxs/prior treatment) HPI  Past Medical History  Diagnosis Date  . Coma   . Post-operative complication     Past Surgical History  Procedure Laterality Date  . Gastric bypass      History reviewed. No pertinent family history.  History  Substance Use Topics  . Smoking status: Current Some Day Smoker  . Smokeless tobacco: Not on file  . Alcohol Use: No    OB History   Grav Para Term Preterm Abortions TAB SAB Ect Mult Living                  Review of Systems  All systems reviewed and negative, other than as noted in HPI.   Allergies  Review of patient's allergies indicates no known allergies.  Home Medications   Current Outpatient Rx  Name  Route  Sig  Dispense  Refill  . amphetamine-dextroamphetamine (ADDERALL) 20 MG tablet   Oral   Take 20 mg by mouth 2 (two) times daily. Sometimes takes 1 tablet(20mg ) every morning and 1/2 tablet(10mg ) every evening         . Multiple Vitamin (MULTIVITAMIN WITH MINERALS) TABS   Oral   Take 1 tablet by mouth daily.           BP 130/94  Temp(Src) 98.7 F (37.1 C) (Oral)  Resp 20  Ht 5\' 2"  (1.575 m)  Wt 114 lb (51.71 kg)  BMI 20.85 kg/m2  SpO2 99%  Physical Exam  Nursing note and vitals reviewed. Constitutional: She appears well-developed and well-nourished. No distress.  HENT:  Head: Normocephalic and atraumatic.  Right Ear: External ear normal.  Left Ear:  External ear normal.  Mouth/Throat: Oropharynx is clear and moist. No oropharyngeal exudate.  Eyes: Conjunctivae and EOM are normal. Pupils are equal, round, and reactive to light. Right eye exhibits no discharge. Left eye exhibits no discharge.  Neck: Neck supple.  Cardiovascular: Normal rate, regular rhythm and normal heart sounds.  Exam reveals no gallop and no friction rub.   No murmur heard. Pulmonary/Chest: Effort normal and breath sounds normal. No respiratory distress.  Abdominal: Soft. She exhibits no distension. There is no tenderness.  Musculoskeletal: She exhibits no edema and no tenderness.  Neurological: She is alert.  Skin: Skin is warm and dry.  Small scattered excoriations on face. No cellulitis. No drainage. No worms.   Psychiatric: She has a normal mood and affect. Her behavior is normal. Thought content normal.    ED Course  Procedures (including critical care time)  Labs Reviewed  OVA AND PARASITE EXAMINATION  OVA AND PARASITE EXAMINATION   No results found.   1. 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 Pt picking nose and insisting there are worms in it. I watched several videos on her phone of her filming her eye. I did not see any concerning footage. Skin lesions are non specific. Pt did bring stool sample.  Will send ova and parasites. Pt's symptoms not consistent with any filarial or other parasitic disease that I am aware of,         Raeford Razor, MD 03/17/12 [phone]

## 2012-03-19 ENCOUNTER — Telehealth (HOSPITAL_COMMUNITY): Payer: Self-pay | Admitting: Emergency Medicine

## 2012-03-19 NOTE — ED Notes (Signed)
Pt calling for lab results.  ID verified.  Pt informed Ova & Parasite results not back at this time.

## 2012-03-20 ENCOUNTER — Telehealth (HOSPITAL_COMMUNITY): Payer: Self-pay | Admitting: Emergency Medicine

## 2012-03-20 LAB — OVA AND PARASITE EXAMINATION: Ova and parasites: NONE SEEN

## 2012-03-20 NOTE — ED Notes (Signed)
Pt calling for results of Ova and Parasites test.  Pt informed results not final.

## 2012-03-21 ENCOUNTER — Telehealth (HOSPITAL_COMMUNITY): Payer: Self-pay | Admitting: Emergency Medicine

## 2012-03-21 NOTE — ED Notes (Signed)
Pt called for results.  Informed results came back "No Ova or Parasites seen"

## 2012-12-26 ENCOUNTER — Emergency Department (HOSPITAL_COMMUNITY): Payer: Self-pay

## 2012-12-26 ENCOUNTER — Emergency Department (HOSPITAL_COMMUNITY)
Admission: EM | Admit: 2012-12-26 | Discharge: 2012-12-26 | Disposition: A | Discharge status: Home / Self Care | Payer: Self-pay | Attending: Emergency Medicine | Admitting: Emergency Medicine

## 2012-12-26 ENCOUNTER — Encounter (HOSPITAL_COMMUNITY): Payer: Self-pay | Admitting: Emergency Medicine

## 2012-12-26 DIAGNOSIS — Z3202 Encounter for pregnancy test, result negative: Secondary | ICD-10-CM | POA: Insufficient documentation

## 2012-12-26 DIAGNOSIS — R109 Unspecified abdominal pain: Secondary | ICD-10-CM

## 2012-12-26 DIAGNOSIS — K2970 Gastritis, unspecified, without bleeding: Secondary | ICD-10-CM | POA: Insufficient documentation

## 2012-12-26 DIAGNOSIS — F17200 Nicotine dependence, unspecified, uncomplicated: Secondary | ICD-10-CM | POA: Insufficient documentation

## 2012-12-26 DIAGNOSIS — Z87828 Personal history of other (healed) physical injury and trauma: Secondary | ICD-10-CM | POA: Insufficient documentation

## 2012-12-26 DIAGNOSIS — R1012 Left upper quadrant pain: Secondary | ICD-10-CM | POA: Insufficient documentation

## 2012-12-26 DIAGNOSIS — R141 Gas pain: Secondary | ICD-10-CM | POA: Insufficient documentation

## 2012-12-26 DIAGNOSIS — Z9884 Bariatric surgery status: Secondary | ICD-10-CM | POA: Insufficient documentation

## 2012-12-26 DIAGNOSIS — Z79899 Other long term (current) drug therapy: Secondary | ICD-10-CM | POA: Insufficient documentation

## 2012-12-26 DIAGNOSIS — R142 Eructation: Secondary | ICD-10-CM | POA: Insufficient documentation

## 2012-12-26 LAB — CBC WITH DIFFERENTIAL/PLATELET
Basophils Absolute: 0.2 10*3/uL — ABNORMAL HIGH (ref 0.0–0.1)
Basophils Relative: 2 % — ABNORMAL HIGH (ref 0–1)
Eosinophils Absolute: 0.2 10*3/uL (ref 0.0–0.7)
Hemoglobin: 13 g/dL (ref 12.0–15.0)
MCH: 28.6 pg (ref 26.0–34.0)
MCHC: 33.4 g/dL (ref 30.0–36.0)
Monocytes Relative: 9 % (ref 3–12)
Neutro Abs: 5.4 10*3/uL (ref 1.7–7.7)
Neutrophils Relative %: 65 % (ref 43–77)
Platelets: 250 10*3/uL (ref 150–400)
RDW: 13.1 % (ref 11.5–15.5)

## 2012-12-26 LAB — COMPREHENSIVE METABOLIC PANEL
ALT: 37 U/L — ABNORMAL HIGH (ref 0–35)
AST: 37 U/L (ref 0–37)
Albumin: 4.1 g/dL (ref 3.5–5.2)
Alkaline Phosphatase: 87 U/L (ref 39–117)
Chloride: 102 mEq/L (ref 96–112)
Potassium: 4.3 mEq/L (ref 3.5–5.1)
Sodium: 138 mEq/L (ref 135–145)
Total Bilirubin: 0.3 mg/dL (ref 0.3–1.2)
Total Protein: 7.5 g/dL (ref 6.0–8.3)

## 2012-12-26 LAB — LIPASE, BLOOD: Lipase: 79 U/L — ABNORMAL HIGH (ref 11–59)

## 2012-12-26 LAB — URINALYSIS, ROUTINE W REFLEX MICROSCOPIC
Bilirubin Urine: NEGATIVE
Glucose, UA: NEGATIVE mg/dL
Ketones, ur: NEGATIVE mg/dL
Nitrite: NEGATIVE
Specific Gravity, Urine: 1.022 (ref 1.005–1.030)
pH: 6.5 (ref 5.0–8.0)

## 2012-12-26 LAB — CG4 I-STAT (LACTIC ACID): Lactic Acid, Venous: 1 mmol/L (ref 0.5–2.2)

## 2012-12-26 LAB — POCT I-STAT TROPONIN I

## 2012-12-26 LAB — POCT PREGNANCY, URINE: Preg Test, Ur: NEGATIVE

## 2012-12-26 MED ORDER — DICYCLOMINE HCL 10 MG/ML IM SOLN
20.0000 mg | Freq: Once | INTRAMUSCULAR | Status: AC
  Administered 2012-12-26: 20 mg via INTRAMUSCULAR
  Filled 2012-12-26: qty 2

## 2012-12-26 MED ORDER — MORPHINE SULFATE 4 MG/ML IJ SOLN
4.0000 mg | Freq: Once | INTRAMUSCULAR | Status: DC
  Filled 2012-12-26: qty 1

## 2012-12-26 MED ORDER — PANTOPRAZOLE SODIUM 40 MG IV SOLR
40.0000 mg | Freq: Once | INTRAVENOUS | Status: AC
  Administered 2012-12-26: 40 mg via INTRAVENOUS
  Filled 2012-12-26: qty 40

## 2012-12-26 MED ORDER — HYDROMORPHONE HCL PF 1 MG/ML IJ SOLN
1.0000 mg | Freq: Once | INTRAMUSCULAR | Status: AC
  Administered 2012-12-26: 1 mg via INTRAVENOUS
  Filled 2012-12-26: qty 1

## 2012-12-26 MED ORDER — SODIUM CHLORIDE 0.9 % IV BOLUS (SEPSIS)
1000.0000 mL | Freq: Once | INTRAVENOUS | Status: AC
  Administered 2012-12-26: 1000 mL via INTRAVENOUS

## 2012-12-26 MED ORDER — IOHEXOL 300 MG/ML  SOLN
100.0000 mL | Freq: Once | INTRAMUSCULAR | Status: AC | PRN
  Administered 2012-12-26: 100 mL via INTRAVENOUS

## 2012-12-26 MED ORDER — PANTOPRAZOLE SODIUM 20 MG PO TBEC: 40.0000 mg | DELAYED_RELEASE_TABLET | Freq: Every day | ORAL | Status: DC

## 2012-12-26 MED ORDER — ONDANSETRON HCL 4 MG/2ML IJ SOLN
4.0000 mg | Freq: Once | INTRAMUSCULAR | Status: AC
  Administered 2012-12-26: 4 mg via INTRAVENOUS
  Filled 2012-12-26: qty 2

## 2012-12-26 MED ORDER — IOHEXOL 300 MG/ML  SOLN: 25.0000 mL | INTRAMUSCULAR | Status: AC

## 2012-12-26 NOTE — ED Notes (Signed)
Pt A&Ox4, ambulatory, verbalizing no complaints at this time.

## 2012-12-26 NOTE — ED Notes (Signed)
Pt finished with oral contrast. Tolerated well. CT notified.

## 2012-12-26 NOTE — ED Provider Notes (Signed)
CSN: [id number]     Arrival date & time 12/26/12  0029 History   First MD Initiated Contact with Patient 12/26/12 0231     Chief Complaint  Patient presents with  . Abdominal Pain   (Consider location/radiation/quality/duration/timing/severity/associated sxs/prior Treatment) HPI 47 yo female presents to the ER from home with complaint of abdominal pain, n/v.  No known sick contacts.  No fever, chills.  Pt reports she ate cheese from convenience store that tasted off about 6-8 hours prior to onset of symptoms.  Pt has had abdominal distension, sharp stabbing LUQ pain.  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  Past Medical History  Diagnosis Date  . Coma   . Post-operative complication    Past Surgical History  Procedure Laterality Date  . Gastric bypass     History reviewed. No pertinent family history. History  Substance Use Topics  . Smoking status: Current Some Day Smoker  . Smokeless tobacco: Not on file  . Alcohol Use: No   OB History   Grav Para Term Preterm Abortions TAB SAB Ect Mult Living                 Review of Systems  All other systems reviewed and are negative.    Allergies  Xylocaine  Home Medications   Current Outpatient Rx  Name  Route  Sig  Dispense  Refill  . amphetamine-dextroamphetamine (ADDERALL) 20 MG tablet   Oral   Take 20 mg by mouth 2 (two) times daily. Sometimes takes 1 tablet(20mg ) every morning and 1/2 tablet(10mg ) every evening         . pantoprazole (PROTONIX) 20 MG tablet   Oral   Take 2 tablets (40 mg total) by mouth daily.   30 tablet   0    BP 153/81  Pulse 82  Temp(Src) 98.7 F (37.1 C) (Oral)  Resp 22  SpO2 98% Physical Exam  Constitutional: She is oriented to person, place, and time. She appears well-developed and well-nourished.  HENT:  Head: Normocephalic and atraumatic.  Nose: Nose normal.  Mouth/Throat: Oropharynx is clear and  moist.  Eyes: Conjunctivae and EOM are normal. Pupils are equal, round, and reactive to light.  Neck: Normal range of motion. Neck supple. No JVD present. No tracheal deviation present. No thyromegaly present.  Cardiovascular: Normal rate, regular rhythm, normal heart sounds and intact distal pulses.  Exam reveals no gallop and no friction rub.   No murmur heard. Pulmonary/Chest: Effort normal and breath sounds normal. No stridor. No respiratory distress. She has no wheezes. She has no rales. She exhibits no tenderness.  Abdominal: Soft. Bowel sounds are normal. She exhibits distension (mild upper). She exhibits no mass. There is tenderness (diffuse worse in LUQ). There is no rebound and no guarding.  Musculoskeletal: Normal range of motion. She exhibits no edema and no tenderness.  Lymphadenopathy:    She has no cervical adenopathy.  Neurological: She is alert and oriented to person, place, and time. She exhibits normal muscle tone. Coordination normal.  Skin: Skin is warm and dry. No rash noted. No erythema. No pallor.  Psychiatric: She has a normal mood and affect. Her behavior is normal. Judgment and thought content normal.    ED Course  Procedures (including critical care time) Labs Review Labs Reviewed  CBC WITH DIFFERENTIAL - Abnormal; Notable for the following:    Basophils Relative 2 (*)  Basophils Absolute 0.2 (*)    All other components within normal limits  COMPREHENSIVE METABOLIC PANEL - Abnormal; Notable for the following:    Glucose, Bld 137 (*)    Creatinine, Ser 0.49 (*)    ALT 37 (*)    All other components within normal limits  LIPASE, BLOOD - Abnormal; Notable for the following:    Lipase 79 (*)    All other components within normal limits  URINALYSIS, ROUTINE W REFLEX MICROSCOPIC - Abnormal; Notable for the following:    APPearance HAZY (*)    All other components within normal limits  POCT I-STAT TROPONIN I  CG4 I-STAT (LACTIC ACID)  POCT PREGNANCY, URINE    Imaging Review Ct Abdomen Pelvis W Contrast  12/26/2012   CLINICAL DATA:  Vomiting.  Abdominal pain.  EXAM: CT ABDOMEN AND PELVIS WITH CONTRAST  TECHNIQUE: Multidetector CT imaging of the abdomen and pelvis was performed using the standard protocol following bolus administration of intravenous contrast.  CONTRAST:  OMNIPAQUE IOHEXOL 300 MG/ML  SOLN  COMPARISON:  CT of the abdomen and pelvis performed 02/20/2008, and MRI of the lumbar spine performed 01/15/2010  FINDINGS: The visualized lung bases are clear.  Slight prominence of the intrahepatic biliary ducts is within normal limits status post cholecystectomy. Clips are noted along the gallbladder fossa. The liver and spleen are otherwise unremarkable in appearance. The pancreas and adrenal glands are unremarkable.  The kidneys are unremarkable in appearance. There is no evidence of hydronephrosis. No renal or ureteral stones are seen. No perinephric stranding is appreciated.  No free fluid is identified. The small bowel is unremarkable in appearance. The patient is status post gastric bypass surgery; the gastrojejunal anastomosis is grossly unremarkable in appearance. The antrum of the stomach has a somewhat irregular appearance, with mildly increased wall thickening. This may reflect gastritis of the distal stomach. The duodenum is unremarkable in appearance. No acute vascular abnormalities are seen.  The appendix is normal in caliber, without evidence for appendicitis. The colon contains a small amount of stool and is unremarkable in appearance.  The bladder is mildly distended and grossly unremarkable. The uterus is within normal limits. The ovaries are relatively symmetric; no suspicious adnexal masses are seen. No inguinal lymphadenopathy is seen.  No acute osseous abnormalities are identified.  IMPRESSION: 1. Irregular appearance of the gastric antrum, with increased wall thickening, new from 2010. This may reflect acute gastritis of the distal  stomach. 2. Gastrojejunal anastomosis is unremarkable in appearance.   Electronically Signed   By: Roanna Raider M.D.   On: 12/26/2012 05:21   Dg Abd Acute W/chest  12/26/2012   CLINICAL DATA:  Abdominal pain, nausea and vomiting.  EXAM: ACUTE ABDOMEN SERIES (ABDOMEN 2 VIEW & CHEST 1 VIEW)  COMPARISON:  Chest radiograph performed 04/25/2007, and CT of the abdomen and pelvis performed 02/20/2008  FINDINGS: The lungs are well-aerated and clear. There is no evidence of focal opacification, pleural effusion or pneumothorax. The cardiomediastinal silhouette is within normal limits.  The visualized bowel gas pattern is unremarkable. Scattered stool and air are seen within the colon; there is no evidence of small bowel dilatation to suggest obstruction. No free intra-abdominal air is identified on the provided upright view.  No acute osseous abnormalities are seen; the sacroiliac joints are unremarkable in appearance. Clips are noted within the right upper quadrant, reflecting prior cholecystectomy.  IMPRESSION: 1. Unremarkable bowel gas pattern; no free intra-abdominal air seen. 2. No acute cardiopulmonary process identified.   Electronically  Signed   By: Roanna Raider M.D.   On: 12/26/2012 01:56    EKG Interpretation   None       MDM   1. Gastritis   2. Abdominal pain    47 year old female with nausea and vomiting and upper abdominal pain today, with mild distention.  She has a history of or in line.  She is concern for possible adhesions.  Workup with normal labs, CAT scan, with out obstruction.  Patient has tolerated contrast.  We'll place on Protonix    Olivia Mackie, MD 12/26/12 2059

## 2012-12-26 NOTE — ED Notes (Signed)
Lactic Acid reported to Dr.Otter 

## 2012-12-26 NOTE — ED Notes (Signed)
Patient stated that she has noticed the top of her stomach has started to bulge out (had gastric bypass 2009)  She vomited and then her stomach really started to hurt.

## 2014-04-22 ENCOUNTER — Encounter (HOSPITAL_COMMUNITY): Payer: Self-pay

## 2014-04-22 ENCOUNTER — Emergency Department (INDEPENDENT_AMBULATORY_CARE_PROVIDER_SITE_OTHER)
Admission: EM | Admit: 2014-04-22 | Discharge: 2014-04-22 | Disposition: A | Discharge status: Home / Self Care | Payer: Medicaid Other | Source: Home / Self Care | Attending: Emergency Medicine | Admitting: Emergency Medicine

## 2014-04-22 DIAGNOSIS — N390 Urinary tract infection, site not specified: Secondary | ICD-10-CM

## 2014-04-22 LAB — POCT URINALYSIS DIP (DEVICE)
BILIRUBIN URINE: NEGATIVE
GLUCOSE, UA: NEGATIVE mg/dL
Ketones, ur: NEGATIVE mg/dL
NITRITE: POSITIVE — AB
Protein, ur: 100 mg/dL — AB
UROBILINOGEN UA: 0.2 mg/dL (ref 0.0–1.0)
pH: 6.5 (ref 5.0–8.0)

## 2014-04-22 MED ORDER — PHENAZOPYRIDINE HCL 200 MG PO TABS: 200.0000 mg | ORAL_TABLET | Freq: Three times a day (TID) | ORAL | Status: DC

## 2014-04-22 MED ORDER — NITROFURANTOIN MONOHYD MACRO 100 MG PO CAPS: 100.0000 mg | ORAL_CAPSULE | Freq: Two times a day (BID) | ORAL | Status: DC

## 2014-04-22 NOTE — ED Notes (Signed)
Please call [phone] for any lab issues

## 2014-04-22 NOTE — Discharge Instructions (Signed)

## 2014-04-22 NOTE — ED Provider Notes (Signed)
CSN: [id number]     Arrival date & time 04/22/14  1148 History   First MD Initiated Contact with Patient 04/22/14 1302     Chief Complaint  Patient presents with  . Urinary Tract Infection   (Consider location/radiation/quality/duration/timing/severity/associated sxs/prior Treatment) HPI Comments: PCP: [facility] No vaginal issues   Patient is a 49 y.o. female presenting with urinary tract infection. The history is provided by the patient.  Urinary Tract Infection This is a new problem. The current episode started 2 days ago. The problem occurs constantly. The problem has been gradually worsening. Associated symptoms comments: +suprapubic pressure, urgency, frequency and hematuria.    Past Medical History  Diagnosis Date  . Coma   . Post-operative complication    Past Surgical History  Procedure Laterality Date  . Gastric bypass     History reviewed. No pertinent family history. History  Substance Use Topics  . Smoking status: Current Some Day Smoker  . Smokeless tobacco: Not on file  . Alcohol Use: No   OB History    No data available     Review of Systems  All other systems reviewed and are negative.   Allergies  Xylocaine  Home Medications   Prior to Admission medications   Medication Sig Start Date End Date Taking? Authorizing Provider  amphetamine-dextroamphetamine (ADDERALL) 20 MG tablet Take 20 mg by mouth 2 (two) times daily. Sometimes takes 1 tablet(20mg ) every morning and 1/2 tablet(10mg ) every evening    Historical Provider, MD  nitrofurantoin, macrocrystal-monohydrate, (MACROBID) 100 MG capsule Take 1 capsule (100 mg total) by mouth 2 (two) times daily. 04/22/14   Mathis FareJennifer Lee H Florenda Watt, PA  pantoprazole (PROTONIX) 20 MG tablet Take 2 tablets (40 mg total) by mouth daily. 12/26/12   Marisa Severinlga Otter, MD  phenazopyridine (PYRIDIUM) 200 MG tablet Take 1 tablet (200 mg total) by mouth 3 (three) times daily. 04/22/14   Jess BartersJennifer Lee H Jayanna Kroeger, PA   BP  161/98 mmHg  Pulse 79  Temp(Src) 98.6 F (37 C) (Oral)  Resp 18  SpO2 97% Physical Exam  Constitutional: She is oriented to person, place, and time. She appears well-developed and well-nourished. No distress.  HENT:  Head: Normocephalic and atraumatic.  Eyes: Conjunctivae are normal.  Cardiovascular: Normal rate.   Pulmonary/Chest: Effort normal.  Abdominal: Soft. She exhibits no distension. There is no tenderness. There is no CVA tenderness.  Musculoskeletal: Normal range of motion.  Neurological: She is alert and oriented to person, place, and time.  Skin: Skin is warm and dry.  Psychiatric: She has a normal mood and affect. Her behavior is normal.  Nursing note and vitals reviewed.   ED Course  Procedures (including critical care time) Labs Review Labs Reviewed  POCT URINALYSIS DIP (DEVICE) - Abnormal; Notable for the following:    Hgb urine dipstick MODERATE (*)    Protein, ur 100 (*)    Nitrite POSITIVE (*)    Leukocytes, UA SMALL (*)    All other components within normal limits  URINE CULTURE    Imaging Review No results found.   MDM   1. UTI (lower urinary tract infection)    UA consistent with UTI Will send specimen for culture and initiate treatment with Macrobid and advise PCP follow up if no improvement    Ria ClockJennifer Lee H Ji Feldner, PA 04/22/14 1346

## 2014-04-22 NOTE — ED Notes (Signed)
Concern for probable UTI; minimal relief w forced fluids

## 2014-04-25 LAB — URINE CULTURE: Special Requests: NORMAL

## 2014-04-27 NOTE — ED Notes (Signed)
Urine culture: >100,000 colonies E. Coli.  Pt. adequately treated with Macrobid.   Vassie MoselleYork, Degan Hanser M 04/27/2014

## 2014-05-20 ENCOUNTER — Emergency Department (INDEPENDENT_AMBULATORY_CARE_PROVIDER_SITE_OTHER)
Admission: EM | Admit: 2014-05-20 | Discharge: 2014-05-20 | Disposition: A | Discharge status: Home / Self Care | Payer: Self-pay | Source: Home / Self Care | Attending: Family Medicine | Admitting: Family Medicine

## 2014-05-20 ENCOUNTER — Other Ambulatory Visit (HOSPITAL_COMMUNITY)
Admission: RE | Admit: 2014-05-20 | Discharge: 2014-05-20 | Disposition: A | Discharge status: Home / Self Care | Payer: Self-pay | Source: Ambulatory Visit | Attending: Family Medicine | Admitting: Family Medicine

## 2014-05-20 ENCOUNTER — Encounter (HOSPITAL_COMMUNITY): Payer: Self-pay | Admitting: Emergency Medicine

## 2014-05-20 DIAGNOSIS — Z113 Encounter for screening for infections with a predominantly sexual mode of transmission: Secondary | ICD-10-CM | POA: Insufficient documentation

## 2014-05-20 DIAGNOSIS — R102 Pelvic and perineal pain: Secondary | ICD-10-CM

## 2014-05-20 DIAGNOSIS — N390 Urinary tract infection, site not specified: Secondary | ICD-10-CM

## 2014-05-20 DIAGNOSIS — N9489 Other specified conditions associated with female genital organs and menstrual cycle: Secondary | ICD-10-CM

## 2014-05-20 DIAGNOSIS — N760 Acute vaginitis: Secondary | ICD-10-CM | POA: Insufficient documentation

## 2014-05-20 LAB — POCT URINALYSIS DIP (DEVICE)
BILIRUBIN URINE: NEGATIVE
Glucose, UA: NEGATIVE mg/dL
Hgb urine dipstick: NEGATIVE
Nitrite: NEGATIVE
PH: 7 (ref 5.0–8.0)
Protein, ur: NEGATIVE mg/dL
Specific Gravity, Urine: 1.02 (ref 1.005–1.030)
Urobilinogen, UA: 1 mg/dL (ref 0.0–1.0)

## 2014-05-20 MED ORDER — CEPHALEXIN 500 MG PO CAPS: 500.0000 mg | ORAL_CAPSULE | Freq: Two times a day (BID) | ORAL | Status: DC

## 2014-05-20 NOTE — ED Provider Notes (Signed)
[name] is a 49 y.o. female who presents to Urgent Care today for urine frequency and urgency. Symptoms present for a few days. She also notes some pelvic pressure. No fevers, chills, NVD. She denies any vaginal discharge. She is well otherwise. She is recently newly sexually active after a 13 year absence. She would like a STD test as well.    Past Medical History  Diagnosis Date  . Coma   . Post-operative complication    Past Surgical History  Procedure Laterality Date  . Gastric bypass     History  Substance Use Topics  . Smoking status: Current Some Day Smoker  . Smokeless tobacco: Not on file  . Alcohol Use: No   ROS as above Medications: No current facility-administered medications for this encounter.   Current Outpatient Prescriptions  Medication Sig Dispense Refill  . amphetamine-dextroamphetamine (ADDERALL) 20 MG tablet Take 20 mg by mouth 2 (two) times daily. Sometimes takes 1 tablet(20mg ) every morning and 1/2 tablet(10mg ) every evening    . cephALEXin (KEFLEX) 500 MG capsule Take 1 capsule (500 mg total) by mouth 2 (two) times daily. 14 capsule 0  . pantoprazole (PROTONIX) 20 MG tablet Take 2 tablets (40 mg total) by mouth daily. 30 tablet 0  . phenazopyridine (PYRIDIUM) 200 MG tablet Take 1 tablet (200 mg total) by mouth 3 (three) times daily. 6 tablet 0   Allergies  Allergen Reactions  . Xylocaine [Lidocaine Hcl]     seizure     Exam:  BP 158/73 mmHg  Pulse 69  Temp(Src) 98 F (36.7 C) (Oral)  Resp 16  SpO2 100% Gen: Well NAD HEENT: EOMI,  MMM Lungs: Normal work of breathing. CTABL Heart: RRR no 3/6 systolic murmur radiating to neck.  Abd: NABS, Soft. Nondistended, Nontender Exts: Brisk capillary refill, warm and well perfused.  GYN: Normal external genitalia. Vaginal canal with scant discharge normal-appearing cervix nontender.  Results for orders placed or performed during the hospital encounter of 05/20/14 (from the past 24 hour(s))  POCT  urinalysis dip (device)     Status: Abnormal   Collection Time: 05/20/14 12:31 PM  Result Value Ref Range   Glucose, UA NEGATIVE NEGATIVE mg/dL   Bilirubin Urine NEGATIVE NEGATIVE   Ketones, ur TRACE (A) NEGATIVE mg/dL   Specific Gravity, Urine 1.020 1.005 - 1.030   Hgb urine dipstick NEGATIVE NEGATIVE   pH 7.0 5.0 - 8.0   Protein, ur NEGATIVE NEGATIVE mg/dL   Urobilinogen, UA 1.0 0.0 - 1.0 mg/dL   Nitrite NEGATIVE NEGATIVE   Leukocytes, UA MODERATE (A) NEGATIVE   No results found.  Assessment and Plan: 49 y.o. female with pelvic pressure likely due to UTI. Urine culture pending treat with Keflex. Vaginal cytology for gonorrhea Chlamydia trichomonas BV yeast pending. Serology for HIV and syphilis also pending. Patient was found to have a systolic murmur radiating to the neck. This is likely aortic stenosis. Her primary care provider is aware of this problem and she's had an ultrasound in the past. Recommend follow-up with PCP for elevated blood pressures well.  Discussed warning signs or symptoms. Please see discharge instructions. Patient expresses understanding.     Dominique BongEvan S Bianca Raneri, MD 05/20/14 [phone]

## 2014-05-20 NOTE — Discharge Instructions (Signed)
Thank you for coming in today. I will call you if anything comes up with your tests.  Come back as needed.  Take keflex twice daily for UTIs.  Return as needed.   Urinary Tract Infection Urinary tract infections (UTIs) can develop anywhere along your urinary tract. Your urinary tract is your body's drainage system for removing wastes and extra water. Your urinary tract includes two kidneys, two ureters, a bladder, and a urethra. Your kidneys are a pair of bean-shaped organs. Each kidney is about the size of your fist. They are located below your ribs, one on each side of your spine. CAUSES Infections are caused by microbes, which are microscopic organisms, including fungi, viruses, and bacteria. These organisms are so small that they can only be seen through a microscope. Bacteria are the microbes that most commonly cause UTIs. SYMPTOMS  Symptoms of UTIs may vary by age and gender of the patient and by the location of the infection. Symptoms in young women typically include a frequent and intense urge to urinate and a painful, burning feeling in the bladder or urethra during urination. Older women and men are more likely to be tired, shaky, and weak and have muscle aches and abdominal pain. A fever may mean the infection is in your kidneys. Other symptoms of a kidney infection include pain in your back or sides below the ribs, nausea, and vomiting. DIAGNOSIS To diagnose a UTI, your caregiver will ask you about your symptoms. Your caregiver also will ask to provide a urine sample. The urine sample will be tested for bacteria and white blood cells. White blood cells are made by your body to help fight infection. TREATMENT  Typically, UTIs can be treated with medication. Because most UTIs are caused by a bacterial infection, they usually can be treated with the use of antibiotics. The choice of antibiotic and length of treatment depend on your symptoms and the type of bacteria causing your  infection. HOME CARE INSTRUCTIONS  If you were prescribed antibiotics, take them exactly as your caregiver instructs you. Finish the medication even if you feel better after you have only taken some of the medication.  Drink enough water and fluids to keep your urine clear or pale yellow.  Avoid caffeine, tea, and carbonated beverages. They tend to irritate your bladder.  Empty your bladder often. Avoid holding urine for long periods of time.  Empty your bladder before and after sexual intercourse.  After a bowel movement, women should cleanse from front to back. Use each tissue only once. SEEK MEDICAL CARE IF:   You have back pain.  You develop a fever.  Your symptoms do not begin to resolve within 3 days. SEEK IMMEDIATE MEDICAL CARE IF:   You have severe back pain or lower abdominal pain.  You develop chills.  You have nausea or vomiting.  You have continued burning or discomfort with urination. MAKE SURE YOU:   Understand these instructions.  Will watch your condition.  Will get help right away if you are not doing well or get worse. Document Released: 09/28/2004 Document Revised: 06/20/2011 Document Reviewed: 01/27/2011 [facility] Patient Information 2015 [employer] [place]. This information is not intended to replace advice given to you by your health care provider. Make sure you discuss any questions you have with your health care provider.

## 2014-05-20 NOTE — ED Notes (Signed)
Dr. Denyse Amassorey is in with the pt Pt c/o poss UTI; seen here last month for similar sx Sx today include abd pain/pressure Alert, no signs of acute distress.

## 2014-05-21 ENCOUNTER — Telehealth (HOSPITAL_COMMUNITY): Payer: Self-pay | Admitting: *Deleted

## 2014-05-21 LAB — RPR: RPR Ser Ql: NONREACTIVE

## 2014-05-21 LAB — HIV ANTIBODY (ROUTINE TESTING W REFLEX): HIV SCREEN 4TH GENERATION: NONREACTIVE

## 2014-05-21 LAB — CERVICOVAGINAL ANCILLARY ONLY
CHLAMYDIA, DNA PROBE: NEGATIVE
NEISSERIA GONORRHEA: NEGATIVE
Wet Prep (BD Affirm): NEGATIVE

## 2014-05-21 NOTE — ED Notes (Addendum)
Pt. called for her lab results.  Pt. verified x 2 and given results. Pt. told that all were negative except the urine culture, that is pending.  It shows 50,000 colonies E. Coli on the preliminary report.  Pt. instructed to finish all of her antibiotics.  If not better to f/u with her PCP.   Vassie MoselleYork, Kyann Heydt M 05/21/2014 Urine culture final and is sensitive to the Keflex. No further action needed. 05/22/2014

## 2014-05-22 LAB — URINE CULTURE
Colony Count: 50000
Special Requests: NORMAL

## 2015-11-08 ENCOUNTER — Encounter (HOSPITAL_COMMUNITY): Payer: Self-pay

## 2015-11-17 ENCOUNTER — Telehealth (HOSPITAL_COMMUNITY): Payer: Self-pay

## 2015-11-17 NOTE — Telephone Encounter (Signed)
This patient is overdue for [facility]. A letter was mailed to the address on file 11/08/15 from both [place] & [facility] in attempt to reestablish post-op care. Letter has been returned to [employer] marked undeliverable, unable to forward. No additional address [facility] or [employer]. Information was shared with Dario GuardianFrances Jackson today at [facility] so she may contact the patient via phone in attempt to get the patient scheduled for an appointment in their office.

## 2016-10-24 ENCOUNTER — Encounter (HOSPITAL_COMMUNITY): Payer: Self-pay

## 2016-12-15 ENCOUNTER — Telehealth (HOSPITAL_COMMUNITY): Payer: Self-pay

## 2016-12-15 NOTE — Telephone Encounter (Signed)
Returned mail; marked undeliverable/unable to forward: This patient is overdue for recommended follow-up [facility]. A letter was mailed to the address on file 10.22.18 from both [place] & [facility] in attempt to reestablish post-op care. Letter has been returned to [employer] marked undeliverable, unable to forward. No additional address on file in [facility] or [employer]. No response yet from e-mail sent to patient at time of letter. 2nd e-mail attempt to reach patient sent today. Information was shared with Dario GuardianFrances Jackson today at [facility] so she may contact the patient via phone again in attempt to get the patient scheduled for an appointment in their office.

## 2020-06-18 ENCOUNTER — Encounter (HOSPITAL_BASED_OUTPATIENT_CLINIC_OR_DEPARTMENT_OTHER): Payer: Self-pay

## 2020-06-18 ENCOUNTER — Other Ambulatory Visit (HOSPITAL_BASED_OUTPATIENT_CLINIC_OR_DEPARTMENT_OTHER): Payer: Self-pay

## 2020-06-18 ENCOUNTER — Emergency Department (HOSPITAL_BASED_OUTPATIENT_CLINIC_OR_DEPARTMENT_OTHER): Payer: No Typology Code available for payment source

## 2020-06-18 ENCOUNTER — Other Ambulatory Visit: Payer: Self-pay

## 2020-06-18 ENCOUNTER — Emergency Department (HOSPITAL_BASED_OUTPATIENT_CLINIC_OR_DEPARTMENT_OTHER)
Admission: EM | Admit: 2020-06-18 | Discharge: 2020-06-18 | Disposition: A | Discharge status: Home / Self Care | Payer: No Typology Code available for payment source | Attending: Emergency Medicine | Admitting: Emergency Medicine

## 2020-06-18 DIAGNOSIS — M26622 Arthralgia of left temporomandibular joint: Secondary | ICD-10-CM | POA: Diagnosis not present

## 2020-06-18 DIAGNOSIS — R4020 Unspecified coma: Secondary | ICD-10-CM | POA: Insufficient documentation

## 2020-06-18 DIAGNOSIS — M26621 Arthralgia of right temporomandibular joint: Secondary | ICD-10-CM

## 2020-06-18 DIAGNOSIS — Z87891 Personal history of nicotine dependence: Secondary | ICD-10-CM | POA: Diagnosis not present

## 2020-06-18 DIAGNOSIS — S0993XA Unspecified injury of face, initial encounter: Secondary | ICD-10-CM | POA: Diagnosis not present

## 2020-06-18 DIAGNOSIS — W228XXA Striking against or struck by other objects, initial encounter: Secondary | ICD-10-CM | POA: Insufficient documentation

## 2020-06-18 DIAGNOSIS — Y990 Civilian activity done for income or pay: Secondary | ICD-10-CM | POA: Diagnosis not present

## 2020-06-18 MED ORDER — ACETAMINOPHEN 500 MG PO TABS
1000.0000 mg | ORAL_TABLET | Freq: Once | ORAL | Status: AC
  Administered 2020-06-18: 1000 mg via ORAL
  Filled 2020-06-18: qty 2

## 2020-06-18 MED ORDER — NAPROXEN 500 MG PO TABS
500.0000 mg | ORAL_TABLET | Freq: Two times a day (BID) | ORAL | 0 refills | Status: DC
  Filled 2020-06-18: qty 14, 7d supply, fill #0

## 2020-06-18 NOTE — ED Notes (Signed)
ED Provider at bedside. 
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
## 2020-06-18 NOTE — ED Notes (Signed)
Pt provided discharge instructions and prescription information. Pt was given the opportunity to ask questions and questions were answered. Discharge signature not obtained in the setting of the COVID-19 pandemic in order to reduce high touch surfaces.  ° °

## 2020-06-18 NOTE — Discharge Instructions (Addendum)
Your CT scan was without any fracture or dislocation.  Please use cool compresses on this area, Tylenol, naproxen and follow-up with your primary care provider.  Ear nose and throat doctors generally follow-up on TMJ issues.  You do have some evidence of some arthritis of the TMJ joint.  I given you the information for an ear nose and throat if you would like to follow-up however primary care providers are a good first touch point.
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
## 2022-05-22 ENCOUNTER — Emergency Department (HOSPITAL_BASED_OUTPATIENT_CLINIC_OR_DEPARTMENT_OTHER)
Admission: EM | Admit: 2022-05-22 | Discharge: 2022-05-22 | Disposition: A | Discharge status: Home / Self Care | Payer: BC Managed Care – PPO | Attending: Emergency Medicine | Admitting: Emergency Medicine

## 2022-05-22 ENCOUNTER — Encounter (HOSPITAL_BASED_OUTPATIENT_CLINIC_OR_DEPARTMENT_OTHER): Payer: Self-pay | Admitting: Urology

## 2022-05-22 ENCOUNTER — Emergency Department (HOSPITAL_BASED_OUTPATIENT_CLINIC_OR_DEPARTMENT_OTHER): Payer: BC Managed Care – PPO

## 2022-05-22 DIAGNOSIS — R0781 Pleurodynia: Secondary | ICD-10-CM

## 2022-05-22 DIAGNOSIS — R109 Unspecified abdominal pain: Secondary | ICD-10-CM | POA: Insufficient documentation

## 2022-05-22 DIAGNOSIS — W010XXA Fall on same level from slipping, tripping and stumbling without subsequent striking against object, initial encounter: Secondary | ICD-10-CM | POA: Insufficient documentation

## 2022-05-22 MED ORDER — CYCLOBENZAPRINE HCL 10 MG PO TABS: 10.0000 mg | ORAL_TABLET | Freq: Two times a day (BID) | ORAL | 0 refills | Status: DC | PRN

## 2022-05-22 NOTE — ED Provider Notes (Signed)
[facility] Provider Note   CSN: [id number] Arrival date & time: 05/22/22  1237     History  Chief Complaint  Patient presents with   Rib Injury    [name] is a 57 y.o. female.  With a history of gastric bypass surgery and cholecystectomy who presents to the ED for evaluation of left lower rib and flank pain.  She states that on 04/27/2022 and she got up in the middle of the night to go to the bathroom but could not open her eyes.  She tripped over a vacuum cleaner and landed on her left side.  She developed some left lower rib and flank pain after that which has remained the same.  She has not taken anything for her symptoms.  She states it is worse with movement.  She is very active at work which is reportedly also making the pain worse.  She denies any chest pain, shortness of breath, cough, hemoptysis.  Reports calling her PCP who encouraged her to come to the ED for further evaluation.  HPI     Home Medications Prior to Admission medications   Medication Sig Start Date End Date Taking? Authorizing Provider  cyclobenzaprine (FLEXERIL) 10 MG tablet Take 1 tablet (10 mg total) by mouth 2 (two) times daily as needed for muscle spasms. 05/22/22  Yes Denina Rieger, Edsel Petrin, PA-C  amphetamine-dextroamphetamine (ADDERALL) 20 MG tablet Take 20 mg by mouth 2 (two) times daily. Sometimes takes 1 tablet(20mg ) every morning and 1/2 tablet(10mg ) every evening    [provider]  cephALEXin (KEFLEX) 500 MG capsule Take 1 capsule (500 mg total) by mouth 2 (two) times daily. 05/20/14   Rodolph Bong, MD  naproxen (NAPROSYN) 500 MG tablet Take 1 tablet (500 mg total) by mouth 2 (two) times daily with a meal. 06/18/20   Fondaw, Wylder S, PA  pantoprazole (PROTONIX) 20 MG tablet Take 2 tablets (40 mg total) by mouth daily. 12/26/12   Marisa Severin, MD  phenazopyridine (PYRIDIUM) 200 MG tablet Take 1 tablet (200 mg total) by mouth 3 (three) times  daily. 04/22/14   Presson, Mathis Fare, PA  triamterene-hydrochlorothiazide (MAXZIDE-25) 37.5-25 MG tablet Take 1 tablet by mouth daily. 04/21/20   [provider]      Allergies    Xylocaine [lidocaine hcl] and Morphine    Review of Systems   Review of Systems  Musculoskeletal:  Positive for back pain.  All other systems reviewed and are negative.   Physical Exam Updated Vital Signs BP (!) 161/89 (BP Location: Left Arm)   Pulse 100   Temp 98.8 F (37.1 C)   Resp 20   Ht 5\' 2"  (1.575 m)   Wt 61.2 kg   SpO2 97%   BMI 24.69 kg/m  Physical Exam Vitals and nursing note reviewed.  Constitutional:      General: She is not in acute distress.    Appearance: Normal appearance. She is well-developed. She is not ill-appearing, toxic-appearing or diaphoretic.     Comments: Resting comfortably in chair  HENT:     Head: Normocephalic and atraumatic.  Eyes:     Conjunctiva/sclera: Conjunctivae normal.  Cardiovascular:     Rate and Rhythm: Normal rate and regular rhythm.     Heart sounds: No murmur heard. Pulmonary:     Effort: Pulmonary effort is normal. No respiratory distress.     Breath sounds: Normal breath sounds. No wheezing, rhonchi or rales.  Abdominal:  Palpations: Abdomen is soft.     Tenderness: There is no abdominal tenderness.  Musculoskeletal:        General: No swelling.     Cervical back: Neck supple.     Comments: Mild TTP to the left lower ribs.  No bruising or ecchymosis, deformities or crepitus  Skin:    General: Skin is warm and dry.     Capillary Refill: Capillary refill takes less than 2 seconds.     Findings: No rash.  Neurological:     General: No focal deficit present.     Mental Status: She is alert and oriented to person, place, and time.  Psychiatric:        Mood and Affect: Mood normal.     ED Results / Procedures / Treatments   Labs (all labs ordered are listed, but only abnormal results are displayed) Labs Reviewed - No data  to display  EKG None  Radiology DG Ribs Unilateral W/Chest Left  Result Date: 05/22/2022 CLINICAL DATA:  Rib injury. Left lower anterior rib pain after fall on vacuum cleaner 425. EXAM: LEFT RIBS AND CHEST - 3+ VIEW COMPARISON:  None Available. FINDINGS: No fracture or other bone lesions are seen involving the ribs. There is no evidence of pneumothorax or pleural effusion. Both lungs are clear. Heart size and mediastinal contours are within normal limits. IMPRESSION: Negative. Electronically Signed   By: Larose Hires D.O.   On: 05/22/2022 13:28    Procedures Procedures    Medications Ordered in ED Medications - No data to display  ED Course/ Medical Decision Making/ A&P                             Medical Decision Making Amount and/or Complexity of Data Reviewed Radiology: ordered.  This patient presents to the ED for concern of left rib pain, this involves an extensive number of treatment options, and is a complaint that carries with it a high risk of complications and morbidity.  The differential diagnosis includes fracture, strain, sprain, contusion  My initial workup includes imaging  Additional history obtained from: Nursing notes from this visit.  I ordered imaging studies including x-ray chest with ribs left I independently visualized and interpreted imaging which showed normal I agree with the radiologist interpretation  Afebrile, hemodynamically stable.  57 year old female presenting to the ED for evaluation of left rib pain.  She fell onto her left side nearly 1 month ago and has had pain in the ribs and musculature surrounding the ribs since that time.  She has not taken anything for her pain.  She is very active at work which has likely contributed to her prolonged symptoms.  Patient may have rib contusion/bruise versus musculature strain.  Imaging reassuring today and shows no fractures or pneumothorax/pleural effusion.  No respiratory symptoms.  There is some TTP to  the affected area and reproducible pain with movement.  Patient is a history of gastric bypass surgery.  She was encouraged to take Tylenol for pain.  She was also sent a prescription for muscle relaxer and educated on appropriate side effects.  She was encouraged to use lidocaine patches as needed.  She was also encouraged to rest as much as possible.  At this time there does not appear to be any evidence of an acute emergency medical condition and the patient appears stable for discharge with appropriate outpatient follow up. Diagnosis was discussed with patient who verbalizes understanding  of care plan and is agreeable to discharge. I have discussed return precautions with patient who verbalizes understanding. Patient encouraged to follow-up with their PCP within 1 week. All questions answered.  Note: Portions of this report may have been transcribed using voice recognition software. Every effort was made to ensure accuracy; however, inadvertent computerized transcription errors may still be present.        Final Clinical Impression(s) / ED Diagnoses Final diagnoses:  Rib pain on left side    Rx / DC Orders ED Discharge Orders          Ordered    cyclobenzaprine (FLEXERIL) 10 MG tablet  2 times daily PRN        05/22/22 1447              [name], Cordelia Poche 05/22/22 1449    Terrilee Files, MD 05/22/22 1742

## 2022-05-22 NOTE — ED Triage Notes (Signed)
Pt states on 4/25 fell during the night  States continued pain to left side of ribs that is continuing to get worse  Pain with certain movements

## 2022-05-22 NOTE — Discharge Instructions (Signed)
You have been seen today for your complaint of left lower rib pain. Your imaging was reassuring and showed no abnormalities. Your discharge medications include Tylenol.  You may Take up to 1000 mg of Tylenol every 6 hours for pain. Flexeril.  This is a muscle relaxant.  Do not drive or operate heavy machinery while taking this medication.  Only take it at night until you know how it affects you. Lidocaine patches.  Apply 1 patch to the affected area every 12 hours.  These are over-the-counter and you may find them in any pharmacy. Follow up with: Your primary care provider in 1 week for reevaluation Please seek immediate medical care if you develop any of the following symptoms: You have nausea or vomiting. You feel sweaty or light-headed. You have a cough with mucus from your lungs (sputum) or you cough up blood. You develop shortness of breath. At this time there does not appear to be the presence of an emergent medical condition, however there is always the potential for conditions to change. Please read and follow the below instructions.  Do not take your medicine if  develop an itchy rash, swelling in your mouth or lips, or difficulty breathing; call 911 and seek immediate emergency medical attention if this occurs.  You may review your lab tests and imaging results in their entirety on your MyChart account.  Please discuss all results of fully with your primary care provider and other specialist at your follow-up visit.  Note: Portions of this text may have been transcribed using voice recognition software. Every effort was made to ensure accuracy; however, inadvertent computerized transcription errors may still be present.

## 2023-06-08 NOTE — Progress Notes (Signed)
 [address] [place] [address] [phone]  First Visit   Subjective   Patient ID:  [name] is a 58 y.o. (DOB 08-01-1965) female.   CC: had concerns including Rectal Bleeding and Constipation.  HPI: 58 yo female with PMH of HTN, gastric bypass (2009) presents today for evaluation of a change in bowel habits. Chart review reveals labs (CMP, CBC, HbA1c) from 06/05/23 were notable for elevated LFT's (AST-170, ALT-91, Alk Phos-124), but were o/w unremarkable with a normal Hb of 14.2.  She last underwent EGD/Colonoscopy in 2020 which were notable for her gastric bypass anatomy, a single adenoma and internal hemorrhoids. She reports today she noted a significant change in bowels late in January 2025.  This seemed to occur after she suffered a suspected flu.  She reports she went from having 1-2 normal BM's daily, to now struggling terribly to move pencil thin stools.  With this she has suffered from episodic BRBPR, and she also reports she suffered from black BM's for a couple of weeks last month, but this has resolved.  Denies any Pepto bismuth.  She also reports sporadic sharp abdominal pains, which can be right or left sided but she is pain free today.  She is a very heavy drinker, drinking ~8 shots of whiskey daily.  Also reports some subtle weight loss (~10-12 lbs) over this time period.     Review of Systems:  Except as stated in the HPI, all other systems reviewed and are negative.   Past Medical History:  Diagnosis Date  . Hypertension     Patient Active Problem List   Diagnosis Date Noted  . History of gastric bypass 06/05/2023  . Alcohol abuse 06/05/2023  . Numbness and tingling of both feet 06/05/2023  . History of colon polyps 06/05/2023  . Family history of colon cancer in mother 06/05/2023  . Melena 06/05/2023  . Change in bowel habits 06/05/2023  . Prediabetes 12/07/2021  . Systolic murmur 12/06/2021    Mitral valve prolapse per  patient. No prior evaluation/documentation noted. Chronic since young adulthood per patient   . Lower abdominal pain 12/16/2018  . Adrenal nodule (*) 06/07/2018  . Bone lesion 06/07/2018  . Essential hypertension 06/07/2018  . Former tobacco use 06/07/2018   Medications: Outpatient Medications Marked as Taking for the 06/08/23 encounter (Initial consult) with Dominique SHAUNNA Delude, MD  Medication Sig Dispense Refill  . amLODIPine besylate (NORVASC) 5 mg tablet Take one tablet (5 mg dose) by mouth daily. 90 tablet 0  . olopatadine HCl (PATANOL) 0.1% ophthalmic solution Place one drop into both eyes 2 (two) times daily. 5 mL 1  . triamterene-hydrochlorothiazide (MAXZIDE-25) 37.5-25 mg per tablet Take one tablet by mouth daily. 90 tablet 0     Allergies  Allergen Reactions  . Xylocaine [Lidocaine] Seizures  . Morphine  Other    Does not respond to Morphine   . Lisinopril Cough     Past Surgical History:  Procedure Laterality Date  . Cholecystectomy    . Colonoscopy  2013  . Right vertebral    . Roux-en-y procedure     x 4  . Small intestine surgery    . Upper gastrointestinal endoscopy  2009    Social History   Socioeconomic History  . Marital status: Widowed  . Number of children: 2  Occupational History  . Occupation: [employer]  Tobacco Use  . Smoking status: Former    Current packs/day: 0.00    Average packs/day: 0.3 packs/day  for 5.0 years (1.3 ttl pk-yrs)    Types: Cigarettes    Start date: 2015    Quit date: 2020    Years since quitting: 5.4    Passive exposure: Past  . Smokeless tobacco: Never  Vaping Use  . Vaping status: Every Day  . Start date: 06/05/2018  . Devices: Disposable  Substance and Sexual Activity  . Alcohol use: Not Currently  . Drug use: Never    Family History  Problem Relation Age of Onset  . Arthritis Mother   . Colon polyps Mother   . Diabetes Father   . Colon polyps Father   . Breast cancer Sister   . Colon cancer Neg Hx       Objective   BP 111/78 (BP Location: Right Upper Arm, Patient Position: Sitting)   Pulse (!) 129   Temp 98.2 F (36.8 C) (Oral)   Ht 5' 2 (1.575 m)   Wt 122 lb (55.3 kg)   BMI 22.31 kg/m     General Appearance:  Alert, cooperative, no distress, appears stated age  Head:  Normocephalic, without obvious abnormality, atraumatic  Eyes:  PERRL, conjunctiva/corneas clear  Nose: Nares normal, mucosa normal  Throat: Lips, mucosa, and tongue normal; teeth and gums normal  Neck: Supple, symmetrical, trachea midline, no adenopathy; thyroid without tenderness/mass/nodules; no JVD  Lungs:   Clear to auscultation bilaterally, respirations unlabored, excursion symmetrical  Breasts:  Deferred  Heart:  Regular rate and rhythm, S1 and S2 normal, no murmur, rub, or gallop  Abdomen:   Soft, mild generalized tenderness, bowel sounds normoactive, no masses, no organomegaly  Pelvic: Deferred  Extremities: Extremities normal, atraumatic, no cyanosis or edema, pulses 2+ symmetrically  Skin: Skin color, texture, turgor normal, no rashes or lesions  Lymph nodes: No cervical, supraclavicular, and axillary adenopathy  Neurologic: Alert, oriented x3, nonfocal exam      Assessment   1. Melena   2. Rectal bleed   3. Change in bowel habits   4. Generalized abdominal pain   5. Alcohol abuse      Plan  -Continue current medications.    Orders Placed This Encounter  Procedures  . EGD, Colonoscopy   Patient Instructions  [name] has suffered a rather dramatic change in bowels dating back to January, associated with episodic rectal bleeding and melena.  In addition, she is a heavy drinker, which she is trying to back off on.  Fortunately, her labs are all stable, but we are arranging for EGD/Colonoscopy for further evaluation.     Electronically Signed: Carlin SHAUNNA Delude, MD 06/08/2023 11:08 AM

## 2023-08-30 ENCOUNTER — Emergency Department (HOSPITAL_COMMUNITY)

## 2023-08-30 ENCOUNTER — Inpatient Hospital Stay (HOSPITAL_COMMUNITY)
Admission: EM | Admit: 2023-08-30 | Discharge: 2023-09-07 | DRG: 391 | Disposition: A | Discharge status: Home / Self Care | Attending: Internal Medicine | Admitting: Internal Medicine

## 2023-08-30 ENCOUNTER — Encounter (HOSPITAL_COMMUNITY): Payer: Self-pay

## 2023-08-30 ENCOUNTER — Other Ambulatory Visit: Payer: Self-pay

## 2023-08-30 DIAGNOSIS — E861 Hypovolemia: Secondary | ICD-10-CM | POA: Diagnosis not present

## 2023-08-30 DIAGNOSIS — F1010 Alcohol abuse, uncomplicated: Secondary | ICD-10-CM | POA: Diagnosis not present

## 2023-08-30 DIAGNOSIS — Z6823 Body mass index (BMI) 23.0-23.9, adult: Secondary | ICD-10-CM

## 2023-08-30 DIAGNOSIS — F1020 Alcohol dependence, uncomplicated: Secondary | ICD-10-CM | POA: Diagnosis present

## 2023-08-30 DIAGNOSIS — K208 Other esophagitis without bleeding: Principal | ICD-10-CM | POA: Diagnosis present

## 2023-08-30 DIAGNOSIS — Q399 Congenital malformation of esophagus, unspecified: Secondary | ICD-10-CM | POA: Diagnosis not present

## 2023-08-30 DIAGNOSIS — E860 Dehydration: Secondary | ICD-10-CM | POA: Diagnosis present

## 2023-08-30 DIAGNOSIS — E440 Moderate protein-calorie malnutrition: Secondary | ICD-10-CM | POA: Diagnosis present

## 2023-08-30 DIAGNOSIS — E876 Hypokalemia: Secondary | ICD-10-CM | POA: Diagnosis present

## 2023-08-30 DIAGNOSIS — F17290 Nicotine dependence, other tobacco product, uncomplicated: Secondary | ICD-10-CM | POA: Diagnosis present

## 2023-08-30 DIAGNOSIS — R202 Paresthesia of skin: Secondary | ICD-10-CM | POA: Diagnosis present

## 2023-08-30 DIAGNOSIS — R200 Anesthesia of skin: Secondary | ICD-10-CM | POA: Diagnosis present

## 2023-08-30 DIAGNOSIS — R1319 Other dysphagia: Secondary | ICD-10-CM | POA: Diagnosis present

## 2023-08-30 DIAGNOSIS — I959 Hypotension, unspecified: Secondary | ICD-10-CM | POA: Diagnosis present

## 2023-08-30 DIAGNOSIS — R197 Diarrhea, unspecified: Secondary | ICD-10-CM | POA: Diagnosis not present

## 2023-08-30 DIAGNOSIS — K7010 Alcoholic hepatitis without ascites: Secondary | ICD-10-CM | POA: Diagnosis present

## 2023-08-30 DIAGNOSIS — K852 Alcohol induced acute pancreatitis without necrosis or infection: Secondary | ICD-10-CM | POA: Diagnosis present

## 2023-08-30 DIAGNOSIS — I10 Essential (primary) hypertension: Secondary | ICD-10-CM | POA: Diagnosis present

## 2023-08-30 DIAGNOSIS — E871 Hypo-osmolality and hyponatremia: Secondary | ICD-10-CM | POA: Diagnosis present

## 2023-08-30 DIAGNOSIS — R1310 Dysphagia, unspecified: Secondary | ICD-10-CM | POA: Diagnosis not present

## 2023-08-30 DIAGNOSIS — Z79899 Other long term (current) drug therapy: Secondary | ICD-10-CM | POA: Diagnosis not present

## 2023-08-30 DIAGNOSIS — Z9884 Bariatric surgery status: Secondary | ICD-10-CM | POA: Diagnosis not present

## 2023-08-30 DIAGNOSIS — E610 Copper deficiency: Secondary | ICD-10-CM | POA: Diagnosis present

## 2023-08-30 DIAGNOSIS — R112 Nausea with vomiting, unspecified: Secondary | ICD-10-CM | POA: Diagnosis not present

## 2023-08-30 DIAGNOSIS — R109 Unspecified abdominal pain: Secondary | ICD-10-CM | POA: Diagnosis present

## 2023-08-30 DIAGNOSIS — R1013 Epigastric pain: Secondary | ICD-10-CM

## 2023-08-30 DIAGNOSIS — Z87891 Personal history of nicotine dependence: Secondary | ICD-10-CM | POA: Diagnosis not present

## 2023-08-30 DIAGNOSIS — R1110 Vomiting, unspecified: Secondary | ICD-10-CM | POA: Diagnosis not present

## 2023-08-30 HISTORY — DX: Essential (primary) hypertension: I10

## 2023-08-30 HISTORY — DX: Alcohol induced acute pancreatitis without necrosis or infection: K85.20

## 2023-08-30 HISTORY — DX: Alcohol abuse, uncomplicated: F10.10

## 2023-08-30 LAB — CBC WITH DIFFERENTIAL/PLATELET
Abs Immature Granulocytes: 0.05 K/uL (ref 0.00–0.07)
Basophils Absolute: 0 K/uL (ref 0.0–0.1)
Basophils Relative: 0 %
Eosinophils Absolute: 0 K/uL (ref 0.0–0.5)
Eosinophils Relative: 0 %
HCT: 46.8 % — ABNORMAL HIGH (ref 36.0–46.0)
Hemoglobin: 16.9 g/dL — ABNORMAL HIGH (ref 12.0–15.0)
Immature Granulocytes: 1 %
Lymphocytes Relative: 26 %
Lymphs Abs: 1.8 K/uL (ref 0.7–4.0)
MCH: 32.9 pg (ref 26.0–34.0)
MCHC: 36.1 g/dL — ABNORMAL HIGH (ref 30.0–36.0)
MCV: 91.2 fL (ref 80.0–100.0)
Monocytes Absolute: 0.7 K/uL (ref 0.1–1.0)
Monocytes Relative: 10 %
Neutro Abs: 4.3 K/uL (ref 1.7–7.7)
Neutrophils Relative %: 63 %
Platelets: 254 K/uL (ref 150–400)
RBC: 5.13 MIL/uL — ABNORMAL HIGH (ref 3.87–5.11)
RDW: 12.2 % (ref 11.5–15.5)
WBC: 6.9 K/uL (ref 4.0–10.5)
nRBC: 0 % (ref 0.0–0.2)

## 2023-08-30 LAB — COMPREHENSIVE METABOLIC PANEL WITH GFR
ALT: 38 U/L (ref 0–44)
AST: 70 U/L — ABNORMAL HIGH (ref 15–41)
Albumin: 3.4 g/dL — ABNORMAL LOW (ref 3.5–5.0)
Alkaline Phosphatase: 90 U/L (ref 38–126)
Anion gap: 26 — ABNORMAL HIGH (ref 5–15)
BUN: 16 mg/dL (ref 6–20)
CO2: 23 mmol/L (ref 22–32)
Calcium: 9.2 mg/dL (ref 8.9–10.3)
Chloride: 80 mmol/L — ABNORMAL LOW (ref 98–111)
Creatinine, Ser: 1.01 mg/dL — ABNORMAL HIGH (ref 0.44–1.00)
GFR, Estimated: >60 mL/min (ref >60)
Glucose, Bld: 150 mg/dL — ABNORMAL HIGH (ref 70–99)
Potassium: 2.6 mmol/L — CL (ref 3.5–5.1)
Sodium: 129 mmol/L — ABNORMAL LOW (ref 135–145)
Total Bilirubin: 2.5 mg/dL — ABNORMAL HIGH (ref 0.0–1.2)
Total Protein: 6.2 g/dL — ABNORMAL LOW (ref 6.5–8.1)

## 2023-08-30 LAB — URINALYSIS, ROUTINE W REFLEX MICROSCOPIC
Bilirubin Urine: NEGATIVE
Glucose, UA: NEGATIVE mg/dL
Hgb urine dipstick: NEGATIVE
Ketones, ur: 20 mg/dL — AB
Nitrite: NEGATIVE
Protein, ur: NEGATIVE mg/dL
Specific Gravity, Urine: 1.039 — ABNORMAL HIGH (ref 1.005–1.030)
pH: 6 (ref 5.0–8.0)

## 2023-08-30 LAB — MAGNESIUM: Magnesium: 1.7 mg/dL (ref 1.7–2.4)

## 2023-08-30 LAB — BASIC METABOLIC PANEL WITH GFR
Anion gap: 18 — ABNORMAL HIGH (ref 5–15)
BUN: 12 mg/dL (ref 6–20)
CO2: 22 mmol/L (ref 22–32)
Calcium: 8 mg/dL — ABNORMAL LOW (ref 8.9–10.3)
Chloride: 88 mmol/L — ABNORMAL LOW (ref 98–111)
Creatinine, Ser: 0.79 mg/dL (ref 0.44–1.00)
GFR, Estimated: >60 mL/min (ref >60)
Glucose, Bld: 99 mg/dL (ref 70–99)
Potassium: 3 mmol/L — ABNORMAL LOW (ref 3.5–5.1)
Sodium: 128 mmol/L — ABNORMAL LOW (ref 135–145)

## 2023-08-30 LAB — TROPONIN I (HIGH SENSITIVITY)
Troponin I (High Sensitivity): 4 ng/L (ref <18)
Troponin I (High Sensitivity): 4 ng/L (ref <18)

## 2023-08-30 LAB — LIPASE, BLOOD: Lipase: 180 U/L — ABNORMAL HIGH (ref 11–51)

## 2023-08-30 MED ORDER — FENTANYL CITRATE PF 50 MCG/ML IJ SOSY
25.0000 ug | PREFILLED_SYRINGE | Freq: Once | INTRAMUSCULAR | Status: AC
  Administered 2023-08-30: 25 ug via INTRAVENOUS
  Filled 2023-08-30: qty 1

## 2023-08-30 MED ORDER — ONDANSETRON HCL 4 MG/2ML IJ SOLN
4.0000 mg | Freq: Once | INTRAMUSCULAR | Status: AC
  Administered 2023-08-30: 4 mg via INTRAVENOUS
  Filled 2023-08-30: qty 2

## 2023-08-30 MED ORDER — LACTATED RINGERS IV BOLUS: 1000.0000 mL | Freq: Once | INTRAVENOUS | Status: DC

## 2023-08-30 MED ORDER — POTASSIUM CHLORIDE CRYS ER 20 MEQ PO TBCR
40.0000 meq | EXTENDED_RELEASE_TABLET | Freq: Once | ORAL | Status: AC
  Administered 2023-08-30: 40 meq via ORAL
  Filled 2023-08-30: qty 2

## 2023-08-30 MED ORDER — FOLIC ACID 1 MG PO TABS
1.0000 mg | ORAL_TABLET | Freq: Every day | ORAL | Status: DC
  Administered 2023-08-31 – 2023-09-07 (×8): 1 mg via ORAL
  Filled 2023-08-30 (×8): qty 1

## 2023-08-30 MED ORDER — THIAMINE HCL 100 MG/ML IJ SOLN: 100.0000 mg | Freq: Every day | INTRAMUSCULAR | Status: DC

## 2023-08-30 MED ORDER — POTASSIUM CHLORIDE 2 MEQ/ML IV SOLN
INTRAVENOUS | Status: AC
  Filled 2023-08-30 (×4): qty 1000

## 2023-08-30 MED ORDER — LORAZEPAM 2 MG/ML IJ SOLN
0.0000 mg | INTRAMUSCULAR | Status: AC
  Administered 2023-08-31: 1 mg via INTRAVENOUS
  Administered 2023-09-01 (×2): 2 mg via INTRAVENOUS
  Filled 2023-08-30 (×3): qty 1

## 2023-08-30 MED ORDER — METOCLOPRAMIDE HCL 5 MG/ML IJ SOLN
10.0000 mg | Freq: Once | INTRAMUSCULAR | Status: AC
  Administered 2023-08-30: 10 mg via INTRAVENOUS
  Filled 2023-08-30: qty 2

## 2023-08-30 MED ORDER — LORAZEPAM 2 MG/ML IJ SOLN
0.0000 mg | Freq: Three times a day (TID) | INTRAMUSCULAR | Status: AC
  Administered 2023-09-02 – 2023-09-03 (×3): 2 mg via INTRAVENOUS
  Filled 2023-08-30 (×5): qty 1

## 2023-08-30 MED ORDER — FENTANYL CITRATE PF 50 MCG/ML IJ SOSY
25.0000 ug | PREFILLED_SYRINGE | INTRAMUSCULAR | Status: DC | PRN
  Administered 2023-08-31 (×2): 25 ug via INTRAVENOUS
  Filled 2023-08-30 (×2): qty 1

## 2023-08-30 MED ORDER — SODIUM CHLORIDE 0.9 % IV BOLUS
1000.0000 mL | Freq: Once | INTRAVENOUS | Status: AC
  Administered 2023-08-30: 1000 mL via INTRAVENOUS

## 2023-08-30 MED ORDER — THIAMINE MONONITRATE 100 MG PO TABS
100.0000 mg | ORAL_TABLET | Freq: Every day | ORAL | Status: DC
  Administered 2023-08-31 – 2023-09-07 (×8): 100 mg via ORAL
  Filled 2023-08-30 (×8): qty 1

## 2023-08-30 MED ORDER — ADULT MULTIVITAMIN W/MINERALS CH
1.0000 | ORAL_TABLET | Freq: Every day | ORAL | Status: DC
  Administered 2023-08-31 – 2023-09-07 (×8): 1 via ORAL
  Filled 2023-08-30 (×8): qty 1

## 2023-08-30 MED ORDER — LORAZEPAM 2 MG/ML IJ SOLN
1.0000 mg | INTRAMUSCULAR | Status: AC | PRN
  Administered 2023-09-02 (×2): 2 mg via INTRAVENOUS

## 2023-08-30 MED ORDER — ENOXAPARIN SODIUM 40 MG/0.4ML IJ SOSY
40.0000 mg | PREFILLED_SYRINGE | INTRAMUSCULAR | Status: DC
  Administered 2023-08-31 – 2023-09-07 (×8): 40 mg via SUBCUTANEOUS
  Filled 2023-08-30 (×8): qty 0.4

## 2023-08-30 MED ORDER — LORAZEPAM 1 MG PO TABS: 1.0000 mg | ORAL_TABLET | ORAL | Status: AC | PRN

## 2023-08-30 MED ORDER — LACTATED RINGERS IV BOLUS
1000.0000 mL | Freq: Once | INTRAVENOUS | Status: AC
  Administered 2023-08-30: 1000 mL via INTRAVENOUS

## 2023-08-30 MED ORDER — IOHEXOL 350 MG/ML SOLN
75.0000 mL | Freq: Once | INTRAVENOUS | Status: AC | PRN
  Administered 2023-08-30: 75 mL via INTRAVENOUS

## 2023-08-30 MED ORDER — POTASSIUM CHLORIDE 10 MEQ/100ML IV SOLN
10.0000 meq | INTRAVENOUS | Status: AC
  Administered 2023-08-30 (×4): 10 meq via INTRAVENOUS
  Filled 2023-08-30 (×3): qty 100

## 2023-08-30 NOTE — ED Notes (Signed)
 Unable to provide urine, sample cup in hand

## 2023-08-30 NOTE — ED Notes (Signed)
 Patient transported to CT

## 2023-08-30 NOTE — H&P (Signed)
 History and Physical    [name] FMW:[id number] DOB: 15-Feb-1965 DOA: 08/30/2023  Patient coming from: Home.  Chief Complaint: Abdominal pain nausea and vomiting and diarrhea.  HPI: [name] is a 58 y.o. female with history of alcoholism and prior gastric bypass surgery in 2009 has been experiencing abdominal pain for the last 4 to 5 months.  Symptoms got worse after February of this year probably from a viral gastroenteritis.  Had followed up with [facility] gastroenterologist and was planned to have EGD and colonoscopy but did not follow-up after she lost her medical insurance.  Patient states pain has acutely worsened over the last 2 weeks and over the last 8 days she was unable to keep in anything.  She drinks whiskey every day and has not had any for the last 3 days.  ED Course: In the ER CT abdomen pelvis shows nothing acute.  Chest x-ray did not show anything acute.  EKG showed sinus tachycardia.  Labs were significant for sodium of 129 creatinine increased from normal in the recent past and it is around 1.01 potassium of 2.6 lipase was mildly elevated at 180.  AST of 70.  Total bilirubin of 2.5.  Troponins were negative.  Patient admitted for abdominal pain with nausea vomiting and diarrhea.  Review of Systems: As per HPI, rest all negative.   Past Medical History:  Diagnosis Date   Coma (HCC)    Post-operative complication     Past Surgical History:  Procedure Laterality Date   ABDOMINAL SURGERY     CHOLECYSTECTOMY     GASTRIC BYPASS       reports that she has quit smoking. Her smoking use included cigarettes. She has never used smokeless tobacco. She reports that she does not drink alcohol and does not use drugs.  Allergies  Allergen Reactions   Xylocaine [Lidocaine Hcl]     seizure   Morphine      Family History  Family history unknown: Yes    Prior to Admission medications   Medication Sig Start Date End Date Taking? Authorizing Provider  amLODipine  (NORVASC) 5 MG tablet Take 5 mg by mouth daily. 06/05/23  Yes [provider]  triamterene-hydrochlorothiazide (MAXZIDE-25) 37.5-25 MG tablet Take 1 tablet by mouth daily. 04/21/20  Yes [provider]    Physical Exam: Constitutional: Moderately built and nourished. Vitals:   08/30/23 1700 08/30/23 1705 08/30/23 1930 08/30/23 2134  BP: 118/87  134/83   Pulse: (!) 113  100   Resp: (!) 21  20   Temp:  98.5 F (36.9 C)  98.3 F (36.8 C)  TempSrc:  Oral  Oral  SpO2: 100%  99%   Weight:      Height:       Eyes: Anicteric no pallor. ENMT: No discharge from the ears eyes nose or mouth. Neck: No mass felt.  No neck rigidity. Respiratory: No rhonchi or crepitations. Cardiovascular: S1-S2 heard. Abdomen: Soft nontender bowel sounds present. Musculoskeletal: No edema. Skin: No rash. Neurologic: Alert awake oriented time place and person.  Moves all extremities. Psychiatric: Appears normal.  Normal affect.   Labs on Admission: I have personally reviewed following labs and imaging studies  CBC: Recent Labs  Lab 08/30/23 1448  WBC 6.9  NEUTROABS 4.3  HGB 16.9*  HCT 46.8*  MCV 91.2  PLT 254   Basic Metabolic Panel: Recent Labs  Lab 08/30/23 1448 08/30/23 1649 08/30/23 1935  NA 129*  --  128*  K 2.6*  --  3.0*  CL 80*  --  88*  CO2 23  --  22  GLUCOSE 150*  --  99  BUN 16  --  12  CREATININE 1.01*  --  0.79  CALCIUM 9.2  --  8.0*  MG  --  1.7  --    GFR: Estimated Creatinine Clearance: 59.1 mL/min (by C-G formula based on SCr of 0.79 mg/dL). Liver Function Tests: Recent Labs  Lab 08/30/23 1448  AST 70*  ALT 38  ALKPHOS 90  BILITOT 2.5*  PROT 6.2*  ALBUMIN 3.4*   Recent Labs  Lab 08/30/23 1448  LIPASE 180*   No results for input(s): AMMONIA in the last 168 hours. Coagulation Profile: No results for input(s): INR, PROTIME in the last 168 hours. Cardiac Enzymes: No results for input(s): CKTOTAL, CKMB, CKMBINDEX, TROPONINI in  the last 168 hours. BNP (last 3 results) No results for input(s): PROBNP in the last 8760 hours. HbA1C: No results for input(s): HGBA1C in the last 72 hours. CBG: No results for input(s): GLUCAP in the last 168 hours. Lipid Profile: No results for input(s): CHOL, HDL, LDLCALC, TRIG, CHOLHDL, LDLDIRECT in the last 72 hours. Thyroid Function Tests: No results for input(s): TSH, T4TOTAL, FREET4, T3FREE, THYROIDAB in the last 72 hours. Anemia Panel: No results for input(s): VITAMINB12, FOLATE, FERRITIN, TIBC, IRON, RETICCTPCT in the last 72 hours. Urine analysis:    Component Value Date/Time   COLORURINE YELLOW 08/30/2023 1949   APPEARANCEUR CLEAR 08/30/2023 1949   LABSPEC 1.039 (H) 08/30/2023 1949   PHURINE 6.0 08/30/2023 1949   GLUCOSEU NEGATIVE 08/30/2023 1949   HGBUR NEGATIVE 08/30/2023 1949   BILIRUBINUR NEGATIVE 08/30/2023 1949   KETONESUR 20 (A) 08/30/2023 1949   PROTEINUR NEGATIVE 08/30/2023 1949   UROBILINOGEN 1.0 05/20/2014 1231   NITRITE NEGATIVE 08/30/2023 1949   LEUKOCYTESUR MODERATE (A) 08/30/2023 1949   Sepsis Labs: @LABRCNTIP (procalcitonin:4,lacticidven:4) )No results found for this or any previous visit (from the past 240 hours).   Radiological Exams on Admission: CT ABDOMEN PELVIS W CONTRAST Result Date: 08/30/2023 CLINICAL DATA:  abdominal pain, nausea, vomiting EXAM: CT ABDOMEN AND PELVIS WITH CONTRAST TECHNIQUE: Multidetector CT imaging of the abdomen and pelvis was performed using the standard protocol following bolus administration of intravenous contrast. RADIATION DOSE REDUCTION: This exam was performed according to the departmental dose-optimization program which includes automated exposure control, adjustment of the mA and/or kV according to patient size and/or use of iterative reconstruction technique. CONTRAST:  75mL OMNIPAQUE  IOHEXOL  350 MG/ML SOLN COMPARISON:  CT abdomen pelvis 12/26/2012 FINDINGS: Lower chest: No  acute abnormality. Hepatobiliary: The hepatic parenchyma is diffusely hypodense compared to the splenic parenchyma consistent with fatty infiltration. No focal liver abnormality. Status post cholecystectomy. No biliary dilatation. Pancreas: Unremarkable. No pancreatic ductal dilatation or surrounding inflammatory changes. Spleen: Normal in size without focal abnormality. Adrenals/Urinary Tract: Chronic stable 1.2 cm left adrenal nodule with a density of 126 Hounsfield units suggestive of a adrenal adenoma. Bilateral kidneys enhance symmetrically. No hydronephrosis. No hydroureter. The urinary bladder is decompressed and unremarkable. Stomach/Bowel: Roux-en-Y gastric bypass surgical changes. Stomach is within normal limits. No evidence of bowel wall thickening or dilatation. Appendix appears normal. Vascular/Lymphatic: No abdominal aorta or iliac aneurysm. Moderate to severe atherosclerotic plaque of the aorta and its branches. No abdominal, pelvic, or inguinal lymphadenopathy. Reproductive: Uterine fundus subcentimeter intramural fibroid. Uterus and bilateral adnexa are unremarkable. Other: No intraperitoneal free fluid. No intraperitoneal free gas. No organized fluid collection. Musculoskeletal: No abdominal wall hernia or abnormality. No suspicious  lytic or blastic osseous lesions. No acute displaced fracture. IMPRESSION: 1. 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 2. Hepatic steatosis. 3. Uterine fundus subcentimeter intramural fibroid. 4.  Aortic Atherosclerosis (ICD10-I70.0). Electronically Signed   By: Morgane  Naveau M.D.   On: 08/30/2023 17:36   DG Chest 2 View Result Date: 08/30/2023 CLINICAL DATA:  Shortness of breath for 4 days. Tingling in hands and feet. Nausea and vomiting. EXAM: CHEST - 2 VIEW COMPARISON:  05/22/2022 FINDINGS: Normal heart size and pulmonary vascularity. No focal airspace disease or consolidation in  the lungs. No blunting of costophrenic angles. No pneumothorax. Mediastinal contours appear intact. Surgical clips in the right upper quadrant. Mild degenerative changes in the spine and shoulders. IMPRESSION: No active cardiopulmonary disease. Electronically Signed   By: Elsie Gravely M.D.   On: 08/30/2023 16:17    EKG: Independently reviewed.  Sinus tachycardia.  Assessment/Plan Principal Problem:   Abdominal pain Active Problems:   Nausea vomiting and diarrhea   Alcohol abuse   Hyponatremia   Hypokalemia   Essential hypertension    Abdominal pain with nausea vomiting and diarrhea among the differentials include alcoholic gastritis/gastroenteritis and possible pancreatitis.  CT scan does not show anything acute.  Lipase is mildly elevated.  Check stool studies if patient has diarrhea.  Continue with hydration.  Will keep patient on Protonix  IV for now. Elevated LFT likely from alcoholic hepatitis.  Check INR to calculate MELD score.  Check acute hepatitis panel. Hyponatremia and hypokalemia likely from poor oral intake and diarrhea.  In addition patient states she was still taking her diuretics for antihypertension which could be contributing.  Patient receiving potassium replacement IV fluids check urine studies TSH and closely monitor metabolic panel. Hypertension will keep patient on as needed IV labetalol  until patient can take orally. Alcohol abuse on CIWA protocol.  Advised about quitting.  Thiamine . History of gastric bypass surgery.  Since patient has abdominal pain with nausea vomiting diarrhea will need close monitoring further workup and more than 2 midnight stay.   DVT prophylaxis: Lovenox . Code Status: Full code. Family Communication: Discussed with patient. Disposition Plan: Monitored bed. Consults called: None. Admission status: Inpatient.

## 2023-08-30 NOTE — ED Triage Notes (Signed)
 C/O [facility] for 4 days. Pt states hands and feet feel weighted. C/O tingling in bilateral hands and feet. Pt states she hasn't eaten in 8 days. N/v.

## 2023-08-30 NOTE — ED Provider Notes (Signed)
 [place] EMERGENCY DEPARTMENT AT [facility] Provider Note   CSN: [id number] Arrival date & time: 08/30/23  1418     Patient presents with: Shortness of Breath   [name] is a 58 y.o. female.  Patient with past history significant for gastric bypass, cholecystectomy presents to the ED with concerns of nausea, vomiting, and paresthesias.  Reports symptoms been ongoing for 4 days with associated shortness of breath.  States that she feels that her arms are weighted and has tingling bilateral feet.  Reports she has not been able to eat or drink anything for the last 4 days beyond some small amounts of water due to the nausea and vomiting.  Also worsening some epigastric and left upper quadrant abdominal pain.  Denies any hematemesis hematochezia.  States that she has had some changes with her bowel movements with decreased caliber of stool size.  She reports that she was set to have a colonoscopy and EGD with GI but this was canceled when she lost her health insurance.    Shortness of Breath      Prior to Admission medications   Medication Sig Start Date End Date Taking? Authorizing Provider  amLODipine (NORVASC) 5 MG tablet Take 5 mg by mouth daily. 06/05/23  Yes [provider]  triamterene-hydrochlorothiazide (MAXZIDE-25) 37.5-25 MG tablet Take 1 tablet by mouth daily. 04/21/20  Yes [provider]    Allergies: Xylocaine [lidocaine hcl] and Morphine     Review of Systems  Respiratory:  Positive for shortness of breath.   All other systems reviewed and are negative.   Updated Vital Signs BP 134/83   Pulse 100   Temp 98.3 F (36.8 C) (Oral)   Resp 20   Ht 5' 2 (1.575 m)   Wt 57.2 kg   SpO2 99%   BMI 23.05 kg/m   Physical Exam Vitals and nursing note reviewed.  Constitutional:      General: She is not in acute distress.    Appearance: She is well-developed.  HENT:     Head: Normocephalic and atraumatic.  Eyes:     Conjunctiva/sclera:  Conjunctivae normal.  Cardiovascular:     Rate and Rhythm: Normal rate and regular rhythm.     Heart sounds: No murmur heard. Pulmonary:     Effort: Pulmonary effort is normal. No respiratory distress.     Breath sounds: Normal breath sounds. No decreased breath sounds, wheezing, rhonchi or rales.  Abdominal:     Palpations: Abdomen is soft.     Tenderness: There is abdominal tenderness. There is guarding and rebound.     Comments: Rebound tenderness to the periumbilical region. TTP along the epigastrium and LUQ.  Musculoskeletal:        General: No swelling.     Cervical back: Neck supple.  Skin:    General: Skin is warm and dry.     Capillary Refill: Capillary refill takes less than 2 seconds.  Neurological:     Mental Status: She is alert.  Psychiatric:        Mood and Affect: Mood normal.     (all labs ordered are listed, but only abnormal results are displayed) Labs Reviewed  CBC WITH DIFFERENTIAL/PLATELET - Abnormal; Notable for the following components:      Result Value   RBC 5.13 (*)    Hemoglobin 16.9 (*)    HCT 46.8 (*)    MCHC 36.1 (*)    All other components within normal limits  COMPREHENSIVE METABOLIC PANEL  WITH GFR - Abnormal; Notable for the following components:   Sodium 129 (*)    Potassium 2.6 (*)    Chloride 80 (*)    Glucose, Bld 150 (*)    Creatinine, Ser 1.01 (*)    Total Protein 6.2 (*)    Albumin 3.4 (*)    AST 70 (*)    Total Bilirubin 2.5 (*)    Anion gap 26 (*)    All other components within normal limits  URINALYSIS, ROUTINE W REFLEX MICROSCOPIC - Abnormal; Notable for the following components:   Specific Gravity, Urine 1.039 (*)    Ketones, ur 20 (*)    Leukocytes,Ua MODERATE (*)    Bacteria, UA RARE (*)    All other components within normal limits  LIPASE, BLOOD - Abnormal; Notable for the following components:   Lipase 180 (*)    All other components within normal limits  BASIC METABOLIC PANEL WITH GFR - Abnormal; Notable for the  following components:   Sodium 128 (*)    Potassium 3.0 (*)    Chloride 88 (*)    Calcium 8.0 (*)    Anion gap 18 (*)    All other components within normal limits  MAGNESIUM   TROPONIN I (HIGH SENSITIVITY)  TROPONIN I (HIGH SENSITIVITY)    EKG: EKG Interpretation Date/Time:  Thursday August 30 2023 14:43:28 EDT Ventricular Rate:  125 PR Interval:  130 QRS Duration:  84 QT Interval:  340 QTC Calculation: 490 R Axis:   51  Text Interpretation: Sinus tachycardia Right atrial enlargement ST & T wave abnormality, consider inferolateral ischemia Abnormal ECG When compared with ECG of 26-Jun-2008 14:10, PREVIOUS ECG IS PRESENT Confirmed by Bari Flank [phone]) on 08/30/2023 4:24:54 PM  Radiology: CT ABDOMEN PELVIS W CONTRAST Result Date: 08/30/2023 CLINICAL DATA:  abdominal pain, nausea, vomiting EXAM: CT ABDOMEN AND PELVIS WITH CONTRAST TECHNIQUE: Multidetector CT imaging of the abdomen and pelvis was performed using the standard protocol following bolus administration of intravenous contrast. RADIATION DOSE REDUCTION: This exam was performed according to the departmental dose-optimization program which includes automated exposure control, adjustment of the mA and/or kV according to patient size and/or use of iterative reconstruction technique. CONTRAST:  75mL OMNIPAQUE  IOHEXOL  350 MG/ML SOLN COMPARISON:  CT abdomen pelvis 12/26/2012 FINDINGS: Lower chest: No acute abnormality. Hepatobiliary: The hepatic parenchyma is diffusely hypodense compared to the splenic parenchyma consistent with fatty infiltration. No focal liver abnormality. Status post cholecystectomy. No biliary dilatation. Pancreas: Unremarkable. No pancreatic ductal dilatation or surrounding inflammatory changes. Spleen: Normal in size without focal abnormality. Adrenals/Urinary Tract: Chronic stable 1.2 cm left adrenal nodule with a density of 126 Hounsfield units suggestive of a adrenal adenoma. Bilateral kidneys enhance  symmetrically. No hydronephrosis. No hydroureter. The urinary bladder is decompressed and unremarkable. Stomach/Bowel: Roux-en-Y gastric bypass surgical changes. Stomach is within normal limits. No evidence of bowel wall thickening or dilatation. Appendix appears normal. Vascular/Lymphatic: No abdominal aorta or iliac aneurysm. Moderate to severe atherosclerotic plaque of the aorta and its branches. No abdominal, pelvic, or inguinal lymphadenopathy. Reproductive: Uterine fundus subcentimeter intramural fibroid. Uterus and bilateral adnexa are unremarkable. Other: No intraperitoneal free fluid. No intraperitoneal free gas. No organized fluid collection. Musculoskeletal: No abdominal wall hernia or abnormality. No suspicious lytic or blastic osseous lesions. No acute displaced fracture. IMPRESSION: 1. 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 2. Hepatic steatosis. 3. Uterine fundus subcentimeter intramural fibroid. 4.  Aortic Atherosclerosis (ICD10-I70.0). Electronically Signed  By: Morgane  Naveau M.D.   On: 08/30/2023 17:36   DG Chest 2 View Result Date: 08/30/2023 CLINICAL DATA:  Shortness of breath for 4 days. Tingling in hands and feet. Nausea and vomiting. EXAM: CHEST - 2 VIEW COMPARISON:  05/22/2022 FINDINGS: Normal heart size and pulmonary vascularity. No focal airspace disease or consolidation in the lungs. No blunting of costophrenic angles. No pneumothorax. Mediastinal contours appear intact. Surgical clips in the right upper quadrant. Mild degenerative changes in the spine and shoulders. IMPRESSION: No active cardiopulmonary disease. Electronically Signed   By: Elsie Gravely M.D.   On: 08/30/2023 16:17     Procedures   Medications Ordered in the ED  lactated ringers  bolus 1,000 mL (0 mLs Intravenous Stopped 08/30/23 1832)  potassium chloride  10 mEq in 100 mL IVPB (10 mEq Intravenous New Bag/Given 08/30/23 2107)   ondansetron  (ZOFRAN ) injection 4 mg (4 mg Intravenous Given 08/30/23 1703)  fentaNYL  (SUBLIMAZE ) injection 25 mcg (25 mcg Intravenous Given 08/30/23 1705)  iohexol  (OMNIPAQUE ) 350 MG/ML injection 75 mL (75 mLs Intravenous Contrast Given 08/30/23 1724)  metoCLOPramide  (REGLAN ) injection 10 mg (10 mg Intravenous Given 08/30/23 1832)  sodium chloride  0.9 % bolus 1,000 mL (0 mLs Intravenous Stopped 08/30/23 1944)  potassium chloride  SA (KLOR-CON  M) CR tablet 40 mEq (40 mEq Oral Given 08/30/23 2157)  fentaNYL  (SUBLIMAZE ) injection 25 mcg (25 mcg Intravenous Given 08/30/23 2155)                                    Medical Decision Making Amount and/or Complexity of Data Reviewed Labs: ordered.  Risk Prescription drug management. Decision regarding hospitalization.   This patient presents to the ED for concern of shortness of breath, this involves an extensive number of treatment options, and is a complaint that carries with it a high risk of complications and morbidity.  The differential diagnosis includes pneumonia, diverticulitis, pancreatitis, gastroenteritis   Co morbidities that complicate the patient evaluation  Alcohol use, prior cholecystectomy   Lab Tests:  I Ordered, and personally interpreted labs.  The pertinent results include: CBC with likely hemoconcentration present with elevations in hemoglobin compared to baseline, CMP with signs of dehydration with anion gap acidosis present at 26 and sodium of 129, potassium of 2.6, chloride at 80.  No obvious signs of AKI.  Lipase elevated at 180.  Troponin initially negative at 4.  Magnesium  unremarkable at 1.7. Repeat BMP after 2L fluids shows improving anion gap at 18 and potassium up to 3.0 with oral repletion that patient tolerated.   Imaging Studies ordered:  I ordered imaging studies including chest x-ray, CT on pelvis I independently visualized and interpreted imaging which showed no acute cardiopulmonary process I agree with  the radiologist interpretation   Cardiac Monitoring: / EKG:  The patient was maintained on a cardiac monitor.  I personally viewed and interpreted the cardiac monitored which showed an underlying rhythm of: Sinus tachycardia   Consultations Obtained:  I requested consultation with the hospitalist,  and discussed lab and imaging findings as well as pertinent plan - they recommend: Spoke with Dr. Franky hospitalist, who will be admitting patient.   Problem List / ED Course / Critical interventions / Medication management  Patient with past history significant for prior cholecystectomy, gastric bypass, and alcohol use presents to the ED with concerns of nausea, vomiting, paresthesias.  She reports ongoing symptoms for the last 4 days with some associated  feeling of shortness of breath primarily in the setting of vomiting.  Denies any chest pain.  States that she is not tolerating any oral intake for approximately 8 days.  Denies any hematemesis, medic easier, or melanotic stool. On exam, patient is uncomfortable but not acutely meeting SIRS criteria.  She is tachycardic but no fever.  Abdomen is tender primarily towards the epigastrium and the left upper quadrant.  No focal right upper quadrant or right lower quadrant pain.  Improvement after fentanyl  but denies any change with nausea after Zofran .  Will try Reglan . Chest x-ray and CT on pelvis negative for any acute findings.  CMP shows elevated anion gap and electrolyte derangements likely secondary to patient's vomiting.  No AKI seen. Will recheck BMP after fluids and potassium given. Magnesium  pending. Magnesium  normal. Attempted PO and patient reports significant pain returned with nausea and dry heaving but no vomiting. Will consult medicine for admission given inability to tolerate PO. Spoke with Dr. Franky, hospitalist, who will be admitting patient.  I ordered medication including fluids, Zofran , fentanyl , Reglan , potassium for  dehydration, nausea, pain, hypokalemia Reevaluation of the patient after these medicines showed that the patient improved I have reviewed the patients home medicines and have made adjustments as needed   Social Determinants of Health:  Alcohol use   Test / Admission - Considered:  Patient meeting inpatient criteria for intractable nausea.  Final diagnoses:  Alcohol-induced acute pancreatitis without infection or necrosis  Hypokalemia  Intractable nausea and vomiting    ED Discharge Orders     None          Cecily Legrand LABOR, PA-C 08/30/23 2322    Pamella Sharper A, DO 09/07/23 1116

## 2023-08-31 DIAGNOSIS — R109 Unspecified abdominal pain: Secondary | ICD-10-CM | POA: Diagnosis not present

## 2023-08-31 LAB — BASIC METABOLIC PANEL WITH GFR
Anion gap: 9 (ref 5–15)
Anion gap: 8 (ref 5–15)
Anion gap: 10 (ref 5–15)
BUN: 10 mg/dL (ref 6–20)
BUN: 7 mg/dL (ref 6–20)
BUN: 7 mg/dL (ref 6–20)
CO2: 28 mmol/L (ref 22–32)
CO2: 28 mmol/L (ref 22–32)
CO2: 28 mmol/L (ref 22–32)
Calcium: 7.7 mg/dL — ABNORMAL LOW (ref 8.9–10.3)
Calcium: 8 mg/dL — ABNORMAL LOW (ref 8.9–10.3)
Calcium: 8.2 mg/dL — ABNORMAL LOW (ref 8.9–10.3)
Chloride: 92 mmol/L — ABNORMAL LOW (ref 98–111)
Chloride: 94 mmol/L — ABNORMAL LOW (ref 98–111)
Chloride: 93 mmol/L — ABNORMAL LOW (ref 98–111)
Creatinine, Ser: 0.69 mg/dL (ref 0.44–1.00)
Creatinine, Ser: 0.77 mg/dL (ref 0.44–1.00)
Creatinine, Ser: 0.68 mg/dL (ref 0.44–1.00)
GFR, Estimated: >60 mL/min (ref >60)
GFR, Estimated: >60 mL/min (ref >60)
GFR, Estimated: >60 mL/min (ref >60)
Glucose, Bld: 166 mg/dL — ABNORMAL HIGH (ref 70–99)
Glucose, Bld: 142 mg/dL — ABNORMAL HIGH (ref 70–99)
Glucose, Bld: 121 mg/dL — ABNORMAL HIGH (ref 70–99)
Potassium: 3.2 mmol/L — ABNORMAL LOW (ref 3.5–5.1)
Potassium: 3.3 mmol/L — ABNORMAL LOW (ref 3.5–5.1)
Potassium: 3.2 mmol/L — ABNORMAL LOW (ref 3.5–5.1)
Sodium: 129 mmol/L — ABNORMAL LOW (ref 135–145)
Sodium: 130 mmol/L — ABNORMAL LOW (ref 135–145)
Sodium: 131 mmol/L — ABNORMAL LOW (ref 135–145)

## 2023-08-31 LAB — CBC WITH DIFFERENTIAL/PLATELET
Abs Immature Granulocytes: 0.02 K/uL (ref 0.00–0.07)
Basophils Absolute: 0 K/uL (ref 0.0–0.1)
Basophils Relative: 1 %
Eosinophils Absolute: 0 K/uL (ref 0.0–0.5)
Eosinophils Relative: 1 %
HCT: 34.3 % — ABNORMAL LOW (ref 36.0–46.0)
Hemoglobin: 12.4 g/dL (ref 12.0–15.0)
Immature Granulocytes: 1 %
Lymphocytes Relative: 40 %
Lymphs Abs: 1.5 K/uL (ref 0.7–4.0)
MCH: 33.2 pg (ref 26.0–34.0)
MCHC: 36.2 g/dL — ABNORMAL HIGH (ref 30.0–36.0)
MCV: 91.7 fL (ref 80.0–100.0)
Monocytes Absolute: 0.4 K/uL (ref 0.1–1.0)
Monocytes Relative: 10 %
Neutro Abs: 1.9 K/uL (ref 1.7–7.7)
Neutrophils Relative %: 47 %
Platelets: 151 K/uL (ref 150–400)
RBC: 3.74 MIL/uL — ABNORMAL LOW (ref 3.87–5.11)
RDW: 12.5 % (ref 11.5–15.5)
WBC: 3.9 K/uL — ABNORMAL LOW (ref 4.0–10.5)
nRBC: 0 % (ref 0.0–0.2)

## 2023-08-31 LAB — CBC
HCT: 34.9 % — ABNORMAL LOW (ref 36.0–46.0)
Hemoglobin: 12.8 g/dL (ref 12.0–15.0)
MCH: 33.7 pg (ref 26.0–34.0)
MCHC: 36.7 g/dL — ABNORMAL HIGH (ref 30.0–36.0)
MCV: 91.8 fL (ref 80.0–100.0)
Platelets: 162 K/uL (ref 150–400)
RBC: 3.8 MIL/uL — ABNORMAL LOW (ref 3.87–5.11)
RDW: 12.4 % (ref 11.5–15.5)
WBC: 4.1 K/uL (ref 4.0–10.5)
nRBC: 0 % (ref 0.0–0.2)

## 2023-08-31 LAB — HEPATIC FUNCTION PANEL
ALT: 29 U/L (ref 0–44)
AST: 50 U/L — ABNORMAL HIGH (ref 15–41)
Albumin: 2.3 g/dL — ABNORMAL LOW (ref 3.5–5.0)
Alkaline Phosphatase: 59 U/L (ref 38–126)
Bilirubin, Direct: 0.2 mg/dL (ref 0.0–0.2)
Indirect Bilirubin: 0.7 mg/dL (ref 0.3–0.9)
Total Bilirubin: 0.9 mg/dL (ref 0.0–1.2)
Total Protein: 4.3 g/dL — ABNORMAL LOW (ref 6.5–8.1)

## 2023-08-31 LAB — VITAMIN B12: Vitamin B-12: 258 pg/mL (ref 180–914)

## 2023-08-31 LAB — TSH: TSH: 1.874 u[IU]/mL (ref 0.350–4.500)

## 2023-08-31 LAB — HIV ANTIBODY (ROUTINE TESTING W REFLEX): HIV Screen 4th Generation wRfx: NONREACTIVE

## 2023-08-31 MED ORDER — POTASSIUM CHLORIDE 10 MEQ/100ML IV SOLN
10.0000 meq | INTRAVENOUS | Status: AC
  Administered 2023-08-31 (×3): 10 meq via INTRAVENOUS
  Filled 2023-08-31 (×3): qty 100

## 2023-08-31 MED ORDER — MAGNESIUM SULFATE 2 GM/50ML IV SOLN
2.0000 g | Freq: Once | INTRAVENOUS | Status: AC
  Administered 2023-08-31: 2 g via INTRAVENOUS
  Filled 2023-08-31: qty 50

## 2023-08-31 MED ORDER — OXYCODONE HCL 5 MG PO TABS
5.0000 mg | ORAL_TABLET | ORAL | Status: DC | PRN
  Administered 2023-08-31 – 2023-09-01 (×5): 5 mg via ORAL
  Filled 2023-08-31 (×5): qty 1

## 2023-08-31 MED ORDER — LABETALOL HCL 5 MG/ML IV SOLN: 5.0000 mg | INTRAVENOUS | Status: DC | PRN

## 2023-08-31 MED ORDER — SODIUM CHLORIDE 0.9 % IV SOLN
1.0000 g | INTRAVENOUS | Status: DC
  Administered 2023-08-31 – 2023-09-01 (×2): 1 g via INTRAVENOUS
  Filled 2023-08-31 (×2): qty 10

## 2023-08-31 MED ORDER — HYDROMORPHONE HCL 1 MG/ML IJ SOLN
0.5000 mg | INTRAMUSCULAR | Status: DC | PRN
  Administered 2023-08-31 – 2023-09-02 (×5): 0.5 mg via INTRAVENOUS
  Filled 2023-08-31 (×5): qty 0.5

## 2023-08-31 MED ORDER — ONDANSETRON HCL 4 MG/2ML IJ SOLN
4.0000 mg | Freq: Four times a day (QID) | INTRAMUSCULAR | Status: DC | PRN
  Administered 2023-08-31 – 2023-09-07 (×19): 4 mg via INTRAVENOUS
  Filled 2023-08-31 (×20): qty 2

## 2023-08-31 MED ORDER — POTASSIUM CHLORIDE 20 MEQ PO PACK
40.0000 meq | PACK | Freq: Once | ORAL | Status: AC
  Administered 2023-08-31: 40 meq via ORAL
  Filled 2023-08-31: qty 2

## 2023-08-31 MED ORDER — PANTOPRAZOLE SODIUM 40 MG IV SOLR
40.0000 mg | INTRAVENOUS | Status: DC
  Administered 2023-08-31 – 2023-09-03 (×4): 40 mg via INTRAVENOUS
  Filled 2023-08-31 (×4): qty 10

## 2023-08-31 NOTE — Plan of Care (Signed)
  Problem: Education: Goal: Knowledge of General Education information will improve Description: Including pain rating scale, medication(s)/side effects and non-pharmacologic comfort measures Outcome: Progressing   Problem: Clinical Measurements: Goal: Will remain free from infection Outcome: Progressing Goal: Diagnostic test results will improve Outcome: Progressing   Problem: Activity: Goal: Risk for activity intolerance will decrease Outcome: Progressing   Problem: Nutrition: Goal: Adequate nutrition will be maintained Outcome: Progressing   Problem: Coping: Goal: Level of anxiety will decrease Outcome: Progressing   Problem: Pain Managment: Goal: General experience of comfort will improve and/or be controlled Outcome: Progressing

## 2023-08-31 NOTE — Progress Notes (Signed)
 PROGRESS NOTE  [name] FMW:[id number] DOB: 06-30-1965 DOA: 08/30/2023 PCP: [facility]   LOS: 1 day   Brief Narrative / Interim history: 58 year old female with gastric bypass surgery 2009, currently not being followed for that anymore, not on any supplementation in terms of vitamins, history of EtOH abuse with at least 4-6 shots of whiskey each day comes into the hospital with nausea, vomiting and poor p.o. intake.  She has been having some abdominal discomfort and symptoms like this for the past few months, but worsening in the last 2 weeks.  Over the last 1 week was unable to eat and drink almost anything.  About 3 days ago she could not drink whiskey so she had to stop.  Experienced slight tremors.  She came to the ED due to persistent symptoms, was found to be hyponatremic, hypokalemic, CT scan of the abdomen pelvis was unremarkable.  She was admitted to the hospital  Subjective / 24h Interval events: Feeling a little bit better after receiving some fluids.  Some nausea but no vomiting this morning.  Denies any diarrhea  Assesement and Plan: Principal Problem:   Abdominal pain Active Problems:   Nausea vomiting and diarrhea   Alcohol abuse   Hyponatremia   Hypokalemia   Essential hypertension  Principal problem Abdominal pain, nausea, vomiting, suspect acute pancreatitis-patient was found to have lipase elevated more than 3 times upper limit of normal, also has epigastric pain with nausea and vomiting.  CT scan did not show acute pancreatitis, however she is very dehydrated and may not be initially apparent.  She, however, meets 2 out of 3 criteria for acute pancreatitis - Continue IV fluids and supportive care - GI pathogen panel was ordered, however she has not had any stools for the past 2 days, had diarrhea prior to that  Active problems Elevated LFTs-likely from alcoholic hepatitis.  Hyponatremia-in the setting of dehydration, sodium  improving  Hypokalemia-potassium gradually improving, continue to monitor and replete aggressively.  Magnesium  1.7  Numbness, tingling -has been going on for the past few days.  For completeness, given gastric bypass, check copper, zinc, B12  History of gastric bypass surgery-will need at least to go back on a multivitamin upon discharge  Alcohol abuse-on CIWA, does not appear to be having significant withdrawals currently  History of hypertension-she is hypotensive here, likely dehydrated, hold home agents  Scheduled Meds:  enoxaparin  (LOVENOX ) injection  40 mg Subcutaneous Q24H   folic acid   1 mg Oral Daily   LORazepam   0-4 mg Intravenous Q4H   Followed by   NOREEN ON 09/01/2023] LORazepam   0-4 mg Intravenous Q8H   multivitamin with minerals  1 tablet Oral Daily   pantoprazole  (PROTONIX ) IV  40 mg Intravenous Q24H   thiamine   100 mg Oral Daily   Or   thiamine   100 mg Intravenous Daily   Continuous Infusions:  cefTRIAXone  (ROCEPHIN )  IV 1 g (08/31/23 0836)   lactated ringers  1,000 mL with potassium chloride  20 mEq infusion 125 mL/hr at 08/31/23 0508   potassium chloride  10 mEq (08/31/23 0832)   PRN Meds:.fentaNYL  (SUBLIMAZE ) injection, labetalol , LORazepam  **OR** LORazepam , ondansetron  (ZOFRAN ) IV  Current Outpatient Medications  Medication Instructions   amLODipine (NORVASC) 5 mg, Oral, Daily   triamterene-hydrochlorothiazide (MAXZIDE-25) 37.5-25 MG tablet 1 tablet, Oral, Daily    Diet Orders (From admission, onward)     Start     Ordered   08/30/23 2326  Diet clear liquid Room service appropriate? Yes; Fluid consistency:  Thin  Diet effective now       Question Answer Comment  Room service appropriate? Yes   Fluid consistency: Thin      08/30/23 2327            DVT prophylaxis: enoxaparin  (LOVENOX ) injection 40 mg Start: 08/31/23 1000   Lab Results  Component Value Date   PLT 151 08/31/2023      Code Status: Full Code  Family Communication: No family at  bedside  Status is: Inpatient Remains inpatient appropriate because: Severity of illness   Level of care: Progressive  Consultants:  None  Objective: Vitals:   08/30/23 2134 08/31/23 0015 08/31/23 0314 08/31/23 0717  BP:  120/71 93/67 125/82  Pulse:  82 85 91  Resp:  16 16 18   Temp: 98.3 F (36.8 C) 99.1 F (37.3 C) 98.4 F (36.9 C) 98.8 F (37.1 C)  TempSrc: Oral Oral Oral Oral  SpO2:  95% 95% 99%  Weight:      Height:        Intake/Output Summary (Last 24 hours) at 08/31/2023 0920 Last data filed at 08/31/2023 0550 Gross per 24 hour  Intake 400.09 ml  Output --  Net 400.09 ml   Wt Readings from Last 3 Encounters:  08/30/23 57.2 kg  05/22/22 61.2 kg  06/18/20 58.1 kg    Examination:  Constitutional: NAD Eyes: no scleral icterus ENMT: Mucous membranes are moist.  Neck: normal, supple Respiratory: clear to auscultation bilaterally, no wheezing, no crackles.  Cardiovascular: Regular rate and rhythm, no murmurs / rubs / gallops.  Abdomen: Gastric tenderness, left lower quadrant tenderness, no guarding or rebound Musculoskeletal: no clubbing / cyanosis.   Data Reviewed: I have independently reviewed following labs and imaging studies   CBC Recent Labs  Lab 08/30/23 1448 08/31/23 0040 08/31/23 0426  WBC 6.9 4.1 3.9*  HGB 16.9* 12.8 12.4  HCT 46.8* 34.9* 34.3*  PLT 254 162 151  MCV 91.2 91.8 91.7  MCH 32.9 33.7 33.2  MCHC 36.1* 36.7* 36.2*  RDW 12.2 12.4 12.5  LYMPHSABS 1.8  --  1.5  MONOABS 0.7  --  0.4  EOSABS 0.0  --  0.0  BASOSABS 0.0  --  0.0    Recent Labs  Lab 08/30/23 1448 08/30/23 1649 08/30/23 1935 08/31/23 0037 08/31/23 0426 08/31/23 0810  NA 129*  --  128* 129* 130* 131*  K 2.6*  --  3.0* 3.2* 3.3* 3.2*  CL 80*  --  88* 92* 94* 93*  CO2 23  --  22 28 28 28   GLUCOSE 150*  --  99 166* 142* 121*  BUN 16  --  12 10 7 7   CREATININE 1.01*  --  0.79 0.69 0.77 0.68  CALCIUM 9.2  --  8.0* 7.7* 8.0* 8.2*  AST 70*  --   --   --  50*   --   ALT 38  --   --   --  29  --   ALKPHOS 90  --   --   --  59  --   BILITOT 2.5*  --   --   --  0.9  --   ALBUMIN 3.4*  --   --   --  2.3*  --   MG  --  1.7  --   --   --   --     ------------------------------------------------------------------------------------------------------------------ No results for input(s): CHOL, HDL, LDLCALC, TRIG, CHOLHDL, LDLDIRECT in the last 72 hours.  No results  found for: HGBA1C ------------------------------------------------------------------------------------------------------------------ No results for input(s): TSH, T4TOTAL, T3FREE, THYROIDAB in the last 72 hours.  Invalid input(s): FREET3  Cardiac Enzymes No results for input(s): CKMB, TROPONINI, MYOGLOBIN in the last 168 hours.  Invalid input(s): CK ------------------------------------------------------------------------------------------------------------------ No results found for: BNP  CBG: No results for input(s): GLUCAP in the last 168 hours.  No results found for this or any previous visit (from the past 240 hours).   Radiology Studies: CT ABDOMEN PELVIS W CONTRAST Result Date: 08/30/2023 CLINICAL DATA:  abdominal pain, nausea, vomiting EXAM: CT ABDOMEN AND PELVIS WITH CONTRAST TECHNIQUE: Multidetector CT imaging of the abdomen and pelvis was performed using the standard protocol following bolus administration of intravenous contrast. RADIATION DOSE REDUCTION: This exam was performed according to the departmental dose-optimization program which includes automated exposure control, adjustment of the mA and/or kV according to patient size and/or use of iterative reconstruction technique. CONTRAST:  75mL OMNIPAQUE  IOHEXOL  350 MG/ML SOLN COMPARISON:  CT abdomen pelvis 12/26/2012 FINDINGS: Lower chest: No acute abnormality. Hepatobiliary: The hepatic parenchyma is diffusely hypodense compared to the splenic parenchyma consistent with fatty infiltration.  No focal liver abnormality. Status post cholecystectomy. No biliary dilatation. Pancreas: Unremarkable. No pancreatic ductal dilatation or surrounding inflammatory changes. Spleen: Normal in size without focal abnormality. Adrenals/Urinary Tract: Chronic stable 1.2 cm left adrenal nodule with a density of 126 Hounsfield units suggestive of a adrenal adenoma. Bilateral kidneys enhance symmetrically. No hydronephrosis. No hydroureter. The urinary bladder is decompressed and unremarkable. Stomach/Bowel: Roux-en-Y gastric bypass surgical changes. Stomach is within normal limits. No evidence of bowel wall thickening or dilatation. Appendix appears normal. Vascular/Lymphatic: No abdominal aorta or iliac aneurysm. Moderate to severe atherosclerotic plaque of the aorta and its branches. No abdominal, pelvic, or inguinal lymphadenopathy. Reproductive: Uterine fundus subcentimeter intramural fibroid. Uterus and bilateral adnexa are unremarkable. Other: No intraperitoneal free fluid. No intraperitoneal free gas. No organized fluid collection. Musculoskeletal: No abdominal wall hernia or abnormality. No suspicious lytic or blastic osseous lesions. No acute displaced fracture. IMPRESSION: 1. 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 2. Hepatic steatosis. 3. Uterine fundus subcentimeter intramural fibroid. 4.  Aortic Atherosclerosis (ICD10-I70.0). Electronically Signed   By: Morgane  Naveau M.D.   On: 08/30/2023 17:36   DG Chest 2 View Result Date: 08/30/2023 CLINICAL DATA:  Shortness of breath for 4 days. Tingling in hands and feet. Nausea and vomiting. EXAM: CHEST - 2 VIEW COMPARISON:  05/22/2022 FINDINGS: Normal heart size and pulmonary vascularity. No focal airspace disease or consolidation in the lungs. No blunting of costophrenic angles. No pneumothorax. Mediastinal contours appear intact. Surgical clips in the right upper quadrant. Mild  degenerative changes in the spine and shoulders. IMPRESSION: No active cardiopulmonary disease. Electronically Signed   By: Elsie Gravely M.D.   On: 08/30/2023 16:17     Nilda Fendt, MD, PhD [facility]  Between 7 am - 7 pm I am available, please contact me via Amion (for emergencies) or Securechat (non urgent messages)  Between 7 pm - 7 am I am not available, please contact night coverage MD/APP via Amion

## 2023-09-01 ENCOUNTER — Encounter (HOSPITAL_COMMUNITY): Payer: Self-pay | Admitting: Internal Medicine

## 2023-09-01 ENCOUNTER — Inpatient Hospital Stay (HOSPITAL_COMMUNITY)

## 2023-09-01 DIAGNOSIS — R109 Unspecified abdominal pain: Secondary | ICD-10-CM | POA: Diagnosis not present

## 2023-09-01 LAB — GASTROINTESTINAL PANEL BY PCR, STOOL (REPLACES STOOL CULTURE)
Adenovirus F40/41: DETECTED — AB
Astrovirus: NOT DETECTED
Campylobacter species: NOT DETECTED
Cryptosporidium: NOT DETECTED
Cyclospora cayetanensis: NOT DETECTED
Entamoeba histolytica: NOT DETECTED
Enteroaggregative E coli (EAEC): NOT DETECTED
Enteropathogenic E coli (EPEC): DETECTED — AB
Enterotoxigenic E coli (ETEC): NOT DETECTED
Giardia lamblia: NOT DETECTED
Norovirus GI/GII: NOT DETECTED
Plesimonas shigelloides: NOT DETECTED
Rotavirus A: NOT DETECTED
Salmonella species: NOT DETECTED
Sapovirus (I, II, IV, and V): NOT DETECTED
Shiga like toxin producing E coli (STEC): NOT DETECTED
Shigella/Enteroinvasive E coli (EIEC): NOT DETECTED
Vibrio cholerae: NOT DETECTED
Vibrio species: NOT DETECTED
Yersinia enterocolitica: NOT DETECTED

## 2023-09-01 LAB — COMPREHENSIVE METABOLIC PANEL WITH GFR
ALT: 27 U/L (ref 0–44)
AST: 35 U/L (ref 15–41)
Albumin: 2.4 g/dL — ABNORMAL LOW (ref 3.5–5.0)
Alkaline Phosphatase: 49 U/L (ref 38–126)
Anion gap: 9 (ref 5–15)
BUN: 6 mg/dL (ref 6–20)
CO2: 27 mmol/L (ref 22–32)
Calcium: 8.1 mg/dL — ABNORMAL LOW (ref 8.9–10.3)
Chloride: 98 mmol/L (ref 98–111)
Creatinine, Ser: 0.67 mg/dL (ref 0.44–1.00)
GFR, Estimated: >60 mL/min (ref >60)
Glucose, Bld: 134 mg/dL — ABNORMAL HIGH (ref 70–99)
Potassium: 4 mmol/L (ref 3.5–5.1)
Sodium: 134 mmol/L — ABNORMAL LOW (ref 135–145)
Total Bilirubin: 0.3 mg/dL (ref 0.0–1.2)
Total Protein: <3 g/dL — ABNORMAL LOW (ref 6.5–8.1)

## 2023-09-01 LAB — ZINC: Zinc: 58 ug/dL (ref 44–115)

## 2023-09-01 LAB — CBC
HCT: 32.9 % — ABNORMAL LOW (ref 36.0–46.0)
Hemoglobin: 11.1 g/dL — ABNORMAL LOW (ref 12.0–15.0)
MCH: 32.9 pg (ref 26.0–34.0)
MCHC: 33.7 g/dL (ref 30.0–36.0)
MCV: 97.6 fL (ref 80.0–100.0)
Platelets: 142 K/uL — ABNORMAL LOW (ref 150–400)
RBC: 3.37 MIL/uL — ABNORMAL LOW (ref 3.87–5.11)
RDW: 12.8 % (ref 11.5–15.5)
WBC: 3.1 K/uL — ABNORMAL LOW (ref 4.0–10.5)
nRBC: 0 % (ref 0.0–0.2)

## 2023-09-01 LAB — COPPER, SERUM: Copper: 44 ug/dL — ABNORMAL LOW (ref 80–158)

## 2023-09-01 LAB — PHOSPHORUS: Phosphorus: 2.9 mg/dL (ref 2.5–4.6)

## 2023-09-01 LAB — C DIFFICILE QUICK SCREEN W PCR REFLEX
C Diff antigen: NEGATIVE
C Diff interpretation: NOT DETECTED
C Diff toxin: NEGATIVE

## 2023-09-01 LAB — MAGNESIUM: Magnesium: 1.6 mg/dL — ABNORMAL LOW (ref 1.7–2.4)

## 2023-09-01 MED ORDER — LACTATED RINGERS IV SOLN: INTRAVENOUS | Status: AC

## 2023-09-01 MED ORDER — AZITHROMYCIN 500 MG PO TABS
500.0000 mg | ORAL_TABLET | Freq: Every day | ORAL | Status: AC
  Administered 2023-09-01 – 2023-09-03 (×3): 500 mg via ORAL
  Filled 2023-09-01 (×3): qty 1

## 2023-09-01 MED ORDER — PROMETHAZINE HCL 12.5 MG RE SUPP: 12.5000 mg | Freq: Four times a day (QID) | RECTAL | Status: DC | PRN

## 2023-09-01 MED ORDER — PROMETHAZINE HCL 12.5 MG PO TABS
12.5000 mg | ORAL_TABLET | Freq: Four times a day (QID) | ORAL | Status: DC | PRN
  Administered 2023-09-02 – 2023-09-06 (×7): 12.5 mg via ORAL
  Filled 2023-09-01 (×10): qty 1

## 2023-09-01 MED ORDER — PROMETHAZINE (PHENERGAN) 6.25MG IN NS 50ML IVPB
6.2500 mg | Freq: Four times a day (QID) | INTRAVENOUS | Status: DC | PRN
  Administered 2023-09-04 (×2): 6.25 mg via INTRAVENOUS
  Filled 2023-09-01: qty 50
  Filled 2023-09-01 (×2): qty 6.25

## 2023-09-01 MED ORDER — MAGNESIUM SULFATE 2 GM/50ML IV SOLN
2.0000 g | Freq: Once | INTRAVENOUS | Status: AC
  Administered 2023-09-01: 2 g via INTRAVENOUS
  Filled 2023-09-01: qty 50

## 2023-09-01 NOTE — Progress Notes (Signed)
 PROGRESS NOTE  [name] FMW:[id number] DOB: 1965-01-14 DOA: 08/30/2023 PCP: [facility]   LOS: 2 days   Brief Narrative / Interim history: 58 year old female with gastric bypass surgery 2009, currently not being followed for that anymore, not on any supplementation in terms of vitamins, history of EtOH abuse with at least 4-6 shots of whiskey each day comes into the hospital with nausea, vomiting and poor p.o. intake.  She has been having some abdominal discomfort and symptoms like this for the past few months, but worsening in the last 2 weeks.  Over the last 1 week was unable to eat and drink almost anything.  About 3 days ago she could not drink whiskey so she had to stop.  Experienced slight tremors.  She came to the ED due to persistent symptoms, was found to be hyponatremic, hypokalemic, CT scan of the abdomen pelvis was unremarkable.  She was admitted to the hospital  Subjective / 24h Interval events: Overall better but had some vomiting this morning as well as last night.  Also diarrhea reccurred yesterday  Assesement and Plan: Principal Problem:   Abdominal pain Active Problems:   Nausea vomiting and diarrhea   Alcohol abuse   Hyponatremia   Hypokalemia   Essential hypertension  Principal problem Abdominal pain, nausea, vomiting, suspect acute pancreatitis-patient was found to have lipase elevated more than 3 times upper limit of normal, also has epigastric pain with nausea and vomiting.  CT scan did not show acute pancreatitis, however she is very dehydrated and may not be initially apparent.  She, however, meets 2 out of 3 criteria for acute pancreatitis - Continue supportive care, pain control, antiemetics.  Leave on clear liquids as she is still having vomiting - Continue to have epigastric abdominal pain as well as left lower quadrant abdominal pain, the left lower quadrant pain has been there for few months  Active problems Elevated LFTs-likely from  alcoholic hepatitis.  LFTs normalized today  Hyponatremia-in the setting of dehydration, improving today  Hypokalemia-potassium now normalized  Hypomagnesemia-replenish magnesium   Numbness, tingling -has been going on for the past few days.  For completeness, given gastric bypass, check copper , zinc , B12.  B12 low normal, will administer 1 dose IM.  Copper  and zinc  pending  History of gastric bypass surgery-will need at least to go back on a multivitamin upon discharge  Alcohol abuse-on CIWA, does not appear to be having significant withdrawals currently  History of hypertension-she is hypotensive here, likely dehydrated, hold home agents  Scheduled Meds:  enoxaparin  (LOVENOX ) injection  40 mg Subcutaneous Q24H   folic acid   1 mg Oral Daily   LORazepam   0-4 mg Intravenous Q4H   Followed by   LORazepam   0-4 mg Intravenous Q8H   multivitamin with minerals  1 tablet Oral Daily   pantoprazole  (PROTONIX ) IV  40 mg Intravenous Q24H   thiamine   100 mg Oral Daily   Or   thiamine   100 mg Intravenous Daily   Continuous Infusions:  cefTRIAXone  (ROCEPHIN )  IV 1 g (09/01/23 0751)   magnesium  sulfate bolus IVPB 2 g (09/01/23 0909)   promethazine  (PHENERGAN ) injection (IM or IVPB)     PRN Meds:.HYDROmorphone  (DILAUDID ) injection, labetalol , LORazepam  **OR** LORazepam , ondansetron  (ZOFRAN ) IV, oxyCODONE , promethazine  **OR** promethazine  (PHENERGAN ) injection (IM or IVPB) **OR** promethazine   Current Outpatient Medications  Medication Instructions   amLODipine (NORVASC) 5 mg, Oral, Daily   triamterene-hydrochlorothiazide (MAXZIDE-25) 37.5-25 MG tablet 1 tablet, Oral, Daily    Diet Orders (From  admission, onward)     Start     Ordered   08/30/23 2326  Diet clear liquid Room service appropriate? Yes; Fluid consistency: Thin  Diet effective now       Question Answer Comment  Room service appropriate? Yes   Fluid consistency: Thin      08/30/23 2327            DVT prophylaxis:  enoxaparin  (LOVENOX ) injection 40 mg Start: 08/31/23 1000   Lab Results  Component Value Date   PLT 142 (L) 09/01/2023      Code Status: Full Code  Family Communication: No family at bedside  Status is: Inpatient Remains inpatient appropriate because: Severity of illness   Level of care: Progressive  Consultants:  None  Objective: Vitals:   09/01/23 0431 09/01/23 0458 09/01/23 0600 09/01/23 0729  BP:    111/85  Pulse: 81 71 65 78  Resp: 16 14 17 15   Temp:    98.5 F (36.9 C)  TempSrc:    Oral  SpO2: 100% 94% 95% 97%  Weight:      Height:        Intake/Output Summary (Last 24 hours) at 09/01/2023 0945 Last data filed at 09/01/2023 0941 Gross per 24 hour  Intake 2513.79 ml  Output 200 ml  Net 2313.79 ml   Wt Readings from Last 3 Encounters:  08/30/23 57.2 kg  05/22/22 61.2 kg  06/18/20 58.1 kg    Examination:  Constitutional: NAD Eyes: lids and conjunctivae normal, no scleral icterus ENMT: mmm Neck: normal, supple Respiratory: clear to auscultation bilaterally, no wheezing, no crackles.  Cardiovascular: Regular rate and rhythm, no murmurs / rubs / gallops.  Abdomen: soft, moderate tenderness to palpation in the epigastric and left lower quadrant areas, no guarding or rebound.  Skin: no rashes  Data Reviewed: I have independently reviewed following labs and imaging studies   CBC Recent Labs  Lab 08/30/23 1448 08/31/23 0040 08/31/23 0426 09/01/23 0514  WBC 6.9 4.1 3.9* 3.1*  HGB 16.9* 12.8 12.4 11.1*  HCT 46.8* 34.9* 34.3* 32.9*  PLT 254 162 151 142*  MCV 91.2 91.8 91.7 97.6  MCH 32.9 33.7 33.2 32.9  MCHC 36.1* 36.7* 36.2* 33.7  RDW 12.2 12.4 12.5 12.8  LYMPHSABS 1.8  --  1.5  --   MONOABS 0.7  --  0.4  --   EOSABS 0.0  --  0.0  --   BASOSABS 0.0  --  0.0  --     Recent Labs  Lab 08/30/23 1448 08/30/23 1649 08/30/23 1935 08/31/23 0037 08/31/23 0426 08/31/23 0810 09/01/23 0514  NA 129*  --  128* 129* 130* 131* 134*  K 2.6*  --  3.0*  3.2* 3.3* 3.2* 4.0  CL 80*  --  88* 92* 94* 93* 98  CO2 23  --  22 28 28 28 27   GLUCOSE 150*  --  99 166* 142* 121* 134*  BUN 16  --  12 10 7 7 6   CREATININE 1.01*  --  0.79 0.69 0.77 0.68 0.67  CALCIUM 9.2  --  8.0* 7.7* 8.0* 8.2* 8.1*  AST 70*  --   --   --  50*  --  35  ALT 38  --   --   --  29  --  27  ALKPHOS 90  --   --   --  59  --  49  BILITOT 2.5*  --   --   --  0.9  --  0.3  ALBUMIN 3.4*  --   --   --  2.3*  --  2.4*  MG  --  1.7  --   --   --   --  1.6*  TSH  --   --   --   --   --  1.874  --     ------------------------------------------------------------------------------------------------------------------ No results for input(s): CHOL, HDL, LDLCALC, TRIG, CHOLHDL, LDLDIRECT in the last 72 hours.  No results found for: HGBA1C ------------------------------------------------------------------------------------------------------------------ Recent Labs    08/31/23 0810  TSH 1.874    Cardiac Enzymes No results for input(s): CKMB, TROPONINI, MYOGLOBIN in the last 168 hours.  Invalid input(s): CK ------------------------------------------------------------------------------------------------------------------ No results found for: BNP  CBG: No results for input(s): GLUCAP in the last 168 hours.  No results found for this or any previous visit (from the past 240 hours).   Radiology Studies: No results found.    Nilda Fendt, MD, PhD [facility]  Between 7 am - 7 pm I am available, please contact me via Amion (for emergencies) or Securechat (non urgent messages)  Between 7 pm - 7 am I am not available, please contact night coverage MD/APP via Amion

## 2023-09-01 NOTE — Plan of Care (Signed)
  Problem: Education: Goal: Knowledge of General Education information will improve Description: Including pain rating scale, medication(s)/side effects and non-pharmacologic comfort measures Outcome: Progressing   Problem: Clinical Measurements: Goal: Will remain free from infection Outcome: Progressing Goal: Diagnostic test results will improve Outcome: Progressing   Problem: Activity: Goal: Risk for activity intolerance will decrease Outcome: Progressing   Problem: Nutrition: Goal: Adequate nutrition will be maintained Outcome: Progressing   Problem: Coping: Goal: Level of anxiety will decrease Outcome: Progressing   Problem: Pain Managment: Goal: General experience of comfort will improve and/or be controlled Outcome: Progressing

## 2023-09-02 DIAGNOSIS — R109 Unspecified abdominal pain: Secondary | ICD-10-CM | POA: Diagnosis not present

## 2023-09-02 LAB — CBC
HCT: 35.2 % — ABNORMAL LOW (ref 36.0–46.0)
Hemoglobin: 11.8 g/dL — ABNORMAL LOW (ref 12.0–15.0)
MCH: 33.3 pg (ref 26.0–34.0)
MCHC: 33.5 g/dL (ref 30.0–36.0)
MCV: 99.4 fL (ref 80.0–100.0)
Platelets: 126 K/uL — ABNORMAL LOW (ref 150–400)
RBC: 3.54 MIL/uL — ABNORMAL LOW (ref 3.87–5.11)
RDW: 13.1 % (ref 11.5–15.5)
WBC: 2.6 K/uL — ABNORMAL LOW (ref 4.0–10.5)
nRBC: 0 % (ref 0.0–0.2)

## 2023-09-02 LAB — BASIC METABOLIC PANEL WITH GFR
Anion gap: 12 (ref 5–15)
BUN: <5 mg/dL — ABNORMAL LOW (ref 6–20)
CO2: 24 mmol/L (ref 22–32)
Calcium: 8.3 mg/dL — ABNORMAL LOW (ref 8.9–10.3)
Chloride: 100 mmol/L (ref 98–111)
Creatinine, Ser: 0.59 mg/dL (ref 0.44–1.00)
GFR, Estimated: >60 mL/min (ref >60)
Glucose, Bld: 94 mg/dL (ref 70–99)
Potassium: 3.6 mmol/L (ref 3.5–5.1)
Sodium: 136 mmol/L (ref 135–145)

## 2023-09-02 LAB — MAGNESIUM: Magnesium: 1.6 mg/dL — ABNORMAL LOW (ref 1.7–2.4)

## 2023-09-02 MED ORDER — OXYCODONE HCL 5 MG PO TABS
5.0000 mg | ORAL_TABLET | ORAL | Status: DC | PRN
  Administered 2023-09-02 – 2023-09-04 (×12): 10 mg via ORAL
  Administered 2023-09-04: 5 mg via ORAL
  Administered 2023-09-04 – 2023-09-07 (×14): 10 mg via ORAL
  Filled 2023-09-02 (×28): qty 2

## 2023-09-02 MED ORDER — POTASSIUM CHLORIDE CRYS ER 20 MEQ PO TBCR
40.0000 meq | EXTENDED_RELEASE_TABLET | Freq: Once | ORAL | Status: AC
  Administered 2023-09-02: 40 meq via ORAL
  Filled 2023-09-02: qty 2

## 2023-09-02 MED ORDER — VITAMIN B-12 1000 MCG PO TABS
1000.0000 ug | ORAL_TABLET | Freq: Every day | ORAL | Status: DC
  Administered 2023-09-03 – 2023-09-07 (×5): 1000 ug via ORAL
  Filled 2023-09-02 (×5): qty 1

## 2023-09-02 MED ORDER — COPPER 2 MG PO TABS
2.0000 mg | Freq: Every day | Status: DC
  Administered 2023-09-02 – 2023-09-03 (×2): 2 mg via ORAL
  Filled 2023-09-02 (×2): qty 1

## 2023-09-02 MED ORDER — MAGNESIUM SULFATE 4 GM/100ML IV SOLN
4.0000 g | Freq: Once | INTRAVENOUS | Status: AC
  Administered 2023-09-02: 4 g via INTRAVENOUS
  Filled 2023-09-02: qty 100

## 2023-09-02 MED ORDER — CYANOCOBALAMIN 1000 MCG/ML IJ SOLN
1000.0000 ug | Freq: Once | INTRAMUSCULAR | Status: AC
  Administered 2023-09-02: 1000 ug via INTRAMUSCULAR
  Filled 2023-09-02: qty 1

## 2023-09-02 NOTE — Progress Notes (Signed)
   09/02/23 0847  What Happened  Was fall witnessed? No  Was patient injured? Unsure  Patient found other (Comment) (In bed. Patient report incident happened overnight)  Found by No one-pt stated they fell  Stated prior activity bathroom-unassisted  Provider Notification  Provider Name/Title Gherghe MD  Date Provider Notified 09/02/23  Time Provider Notified [phone]  Method of Notification Call  Notification Reason Fall  Provider response See new orders (PT eval)  Date of Provider Response 09/02/23  Time of Provider Response [phone]  Follow Up  Family notified No - patient refusal  Additional tests No  Progress note created (see row info) Yes   Per patient, she fell overnight. Patient did not tell night RN of this. Patient did not use call light because she thought Im stable enough. Bed alarm went off but by the time the tech arrived to the room, patient had already fallen beside bed after taking 2 steps. Per patient, another tech came in to assist. No new injuries assessed during this dayshift RN rounding after shift change. This RN notified MD of unwittnessed fall. See new orders for PT eval. Floor mats placed, bed alarm on, and patient reminded to use call light before getting out of bed.

## 2023-09-02 NOTE — Progress Notes (Signed)
   09/02/23 1107  Spiritual Encounters  Type of Visit Initial  Care provided to: Patient  Conversation partners present during encounter Nurse  Referral source Patient request;Nurse (RN/NT/LPN)  Reason for visit Routine spiritual support  OnCall Visit Yes  Interventions  Spiritual Care Interventions Made Prayer;Established relationship of care and support   Chaplain responded to a patient requesting a copy of the Bible. Chaplain provided patient with a copy. Chaplain offered prayer and support.Chaplain introduced spiritual care services. Spiritual care services available as needed.   Juliene CHRISTELLA Das, Chaplain 09/02/23

## 2023-09-02 NOTE — Progress Notes (Signed)
 PROGRESS NOTE  [name] FMW:[id number] DOB: 08-Nov-1965 DOA: 08/30/2023 PCP: [facility]   LOS: 3 days   Brief Narrative / Interim history: 58 year old female with gastric bypass surgery 2009, currently not being followed for that anymore, not on any supplementation in terms of vitamins, history of EtOH abuse with at least 4-6 shots of whiskey each day comes into the hospital with nausea, vomiting and poor p.o. intake.  She has been having some abdominal discomfort and symptoms like this for the past few months, but worsening in the last 2 weeks.  Over the last 1 week was unable to eat and drink almost anything.  About 3 days ago she could not drink whiskey so she had to stop.  Experienced slight tremors.  She came to the ED due to persistent symptoms, was found to be hyponatremic, hypokalemic, CT scan of the abdomen pelvis was unremarkable.  She was admitted to the hospital  Subjective / 24h Interval events: No further vomiting, still has some abdominal pain.  Overall better  Assesement and Plan: Principal Problem:   Abdominal pain Active Problems:   Nausea vomiting and diarrhea   Alcohol abuse   Hyponatremia   Hypokalemia   Essential hypertension  Principal problem Abdominal pain, nausea, vomiting, suspect acute pancreatitis-patient was found to have lipase elevated more than 3 times upper limit of normal, also has epigastric pain with nausea and vomiting.  CT scan did not show acute pancreatitis, however she is very dehydrated and may not be initially apparent.  She, however, met 2 out of 3 criteria for acute pancreatitis - Advance diet today, discontinue IV pain meds - Continue to have epigastric abdominal pain as well as left lower quadrant abdominal pain, the left lower quadrant pain has been there for few months  Active problems Elevated LFTs-likely from alcoholic hepatitis.  LFTs normalized today  Hyponatremia-in the setting of dehydration, improving  today  Hypokalemia-potassium normal, administered an additional dose to bring it close to 4  Hypomagnesemia-replenish magnesium  again today  Numbness, tingling, copper  deficiency-has been going on for the past few days.  B12 was low normal, copper  levels showed deficiency.  Replenish both  History of gastric bypass surgery-will need at least to go back on a multivitamin upon discharge  Alcohol abuse-on CIWA, does not appear to be having significant withdrawals currently  History of hypertension-she is hypotensive here, likely dehydrated, hold home agents  Scheduled Meds:  azithromycin   500 mg Oral Daily   enoxaparin  (LOVENOX ) injection  40 mg Subcutaneous Q24H   folic acid   1 mg Oral Daily   LORazepam   0-4 mg Intravenous Q8H   multivitamin with minerals  1 tablet Oral Daily   pantoprazole  (PROTONIX ) IV  40 mg Intravenous Q24H   thiamine   100 mg Oral Daily   Or   thiamine   100 mg Intravenous Daily   Continuous Infusions:  lactated ringers  Stopped (09/02/23 0837)   promethazine  (PHENERGAN ) injection (IM or IVPB)     PRN Meds:.labetalol , LORazepam  **OR** LORazepam , ondansetron  (ZOFRAN ) IV, oxyCODONE , promethazine  **OR** promethazine  (PHENERGAN ) injection (IM or IVPB) **OR** promethazine   Current Outpatient Medications  Medication Instructions   amLODipine (NORVASC) 5 mg, Oral, Daily   triamterene-hydrochlorothiazide (MAXZIDE-25) 37.5-25 MG tablet 1 tablet, Oral, Daily    Diet Orders (From admission, onward)     Start     Ordered   09/02/23 0802  Diet regular Fluid consistency: Thin  Diet effective now       Question:  Fluid consistency:  Answer:  Thin   09/02/23 0801            DVT prophylaxis: enoxaparin  (LOVENOX ) injection 40 mg Start: 08/31/23 1000   Lab Results  Component Value Date   PLT 142 (L) 09/01/2023      Code Status: Full Code  Family Communication: No family at bedside  Status is: Inpatient Remains inpatient appropriate because: Severity of  illness   Level of care: Med-Surg  Consultants:  None  Objective: Vitals:   09/02/23 0400 09/02/23 0445 09/02/23 0600 09/02/23 0728  BP:  115/86  120/85  Pulse: 87 92 87 98  Resp: 14 15 15    Temp:  98.6 F (37 C)  98.8 F (37.1 C)  TempSrc:  Oral  Oral  SpO2: 92% 97% 94% 95%  Weight:      Height:        Intake/Output Summary (Last 24 hours) at 09/02/2023 0950 Last data filed at 09/02/2023 0700 Gross per 24 hour  Intake 3119.32 ml  Output --  Net 3119.32 ml   Wt Readings from Last 3 Encounters:  08/30/23 57.2 kg  05/22/22 61.2 kg  06/18/20 58.1 kg    Examination:  Constitutional: NAD Eyes: lids and conjunctivae normal, no scleral icterus ENMT: mmm Neck: normal, supple Respiratory: clear to auscultation bilaterally, no wheezing, no crackles.  Cardiovascular: Regular rate and rhythm, no murmurs / rubs / gallops.  Abdomen: soft, no distention, no tenderness. Bowel sounds positive.   Data Reviewed: I have independently reviewed following labs and imaging studies   CBC Recent Labs  Lab 08/30/23 1448 08/31/23 0040 08/31/23 0426 09/01/23 0514  WBC 6.9 4.1 3.9* 3.1*  HGB 16.9* 12.8 12.4 11.1*  HCT 46.8* 34.9* 34.3* 32.9*  PLT 254 162 151 142*  MCV 91.2 91.8 91.7 97.6  MCH 32.9 33.7 33.2 32.9  MCHC 36.1* 36.7* 36.2* 33.7  RDW 12.2 12.4 12.5 12.8  LYMPHSABS 1.8  --  1.5  --   MONOABS 0.7  --  0.4  --   EOSABS 0.0  --  0.0  --   BASOSABS 0.0  --  0.0  --     Recent Labs  Lab 08/30/23 1448 08/30/23 1649 08/30/23 1935 08/31/23 0037 08/31/23 0426 08/31/23 0810 09/01/23 0514 09/02/23 0642  NA 129*  --    < > 129* 130* 131* 134* 136  K 2.6*  --    < > 3.2* 3.3* 3.2* 4.0 3.6  CL 80*  --    < > 92* 94* 93* 98 100  CO2 23  --    < > 28 28 28 27 24   GLUCOSE 150*  --    < > 166* 142* 121* 134* 94  BUN 16  --    < > 10 7 7 6  <5*  CREATININE 1.01*  --    < > 0.69 0.77 0.68 0.67 0.59  CALCIUM 9.2  --    < > 7.7* 8.0* 8.2* 8.1* 8.3*  AST 70*  --   --   --   50*  --  35  --   ALT 38  --   --   --  29  --  27  --   ALKPHOS 90  --   --   --  59  --  49  --   BILITOT 2.5*  --   --   --  0.9  --  0.3  --   ALBUMIN 3.4*  --   --   --  2.3*  --  2.4*  --   MG  --  1.7  --   --   --   --  1.6* 1.6*  TSH  --   --   --   --   --  1.874  --   --    < > = values in this interval not displayed.    ------------------------------------------------------------------------------------------------------------------ No results for input(s): CHOL, HDL, LDLCALC, TRIG, CHOLHDL, LDLDIRECT in the last 72 hours.  No results found for: HGBA1C ------------------------------------------------------------------------------------------------------------------ Recent Labs    08/31/23 0810  TSH 1.874    Cardiac Enzymes No results for input(s): CKMB, TROPONINI, MYOGLOBIN in the last 168 hours.  Invalid input(s): CK ------------------------------------------------------------------------------------------------------------------ No results found for: BNP  CBG: No results for input(s): GLUCAP in the last 168 hours.  Recent Results (from the past 240 hours)  Gastrointestinal Panel by PCR , Stool     Status: Abnormal   Collection Time: 08/31/23  6:16 AM   Specimen: Stool  Result Value Ref Range Status   Campylobacter species NOT DETECTED NOT DETECTED Final   Plesimonas shigelloides NOT DETECTED NOT DETECTED Final   Salmonella species NOT DETECTED NOT DETECTED Final   Yersinia enterocolitica NOT DETECTED NOT DETECTED Final   Vibrio species NOT DETECTED NOT DETECTED Final   Vibrio cholerae NOT DETECTED NOT DETECTED Final   Enteroaggregative E coli (EAEC) NOT DETECTED NOT DETECTED Final   Enteropathogenic E coli (EPEC) DETECTED (A) NOT DETECTED Final    Comment: CRITICAL RESULT CALLED TO, READ BACK BY AND VERIFIED WITH: KELLY TRUONG RN 09/01/23 1017 KG    Enterotoxigenic E coli (ETEC) NOT DETECTED NOT DETECTED Final   Shiga like toxin  producing E coli (STEC) NOT DETECTED NOT DETECTED Final   Shigella/Enteroinvasive E coli (EIEC) NOT DETECTED NOT DETECTED Final   Cryptosporidium NOT DETECTED NOT DETECTED Final   Cyclospora cayetanensis NOT DETECTED NOT DETECTED Final   Entamoeba histolytica NOT DETECTED NOT DETECTED Final   Giardia lamblia NOT DETECTED NOT DETECTED Final   Adenovirus F40/41 DETECTED (A) NOT DETECTED Final   Astrovirus NOT DETECTED NOT DETECTED Final   Norovirus GI/GII NOT DETECTED NOT DETECTED Final   Rotavirus A NOT DETECTED NOT DETECTED Final   Sapovirus (I, II, IV, and V) NOT DETECTED NOT DETECTED Final    Comment: Performed at [facility], [address] [place], [place] [address]  C Difficile Quick Screen w PCR reflex     Status: None   Collection Time: 08/31/23  6:16 AM   Specimen: STOOL  Result Value Ref Range Status   C Diff antigen NEGATIVE NEGATIVE Final   C Diff toxin NEGATIVE NEGATIVE Final   C Diff interpretation No C. difficile detected.  Final    Comment: Performed at [facility], [address], [place], [place] [address]     Radiology Studies: DG Abd Portable 1V Result Date: 09/01/2023 CLINICAL DATA:  Intractable nausea and vomiting. EXAM: PORTABLE ABDOMEN - 1 VIEW COMPARISON:  Abdomen and pelvis CT dated 08/30/2023. FINDINGS: Normal bowel-gas pattern. Cholecystectomy clips and single surgical clip in the right lower abdomen. Unremarkable bones. IMPRESSION: Negative. Electronically Signed   By: Elspeth Bathe M.D.   On: 09/01/2023 10:28      Nilda Fendt, MD, PhD [facility]  Between 7 am - 7 pm I am available, please contact me via Amion (for emergencies) or Securechat (non urgent messages)  Between 7 pm - 7 am I am not available, please contact night coverage  MD/APP via Amion

## 2023-09-03 DIAGNOSIS — R109 Unspecified abdominal pain: Secondary | ICD-10-CM | POA: Diagnosis not present

## 2023-09-03 LAB — COMPREHENSIVE METABOLIC PANEL WITH GFR
ALT: 36 U/L (ref 0–44)
AST: 43 U/L — ABNORMAL HIGH (ref 15–41)
Albumin: 2.6 g/dL — ABNORMAL LOW (ref 3.5–5.0)
Alkaline Phosphatase: 50 U/L (ref 38–126)
Anion gap: 9 (ref 5–15)
BUN: <5 mg/dL — ABNORMAL LOW (ref 6–20)
CO2: 29 mmol/L (ref 22–32)
Calcium: 8.8 mg/dL — ABNORMAL LOW (ref 8.9–10.3)
Chloride: 100 mmol/L (ref 98–111)
Creatinine, Ser: 0.64 mg/dL (ref 0.44–1.00)
GFR, Estimated: >60 mL/min (ref >60)
Glucose, Bld: 103 mg/dL — ABNORMAL HIGH (ref 70–99)
Potassium: 4.1 mmol/L (ref 3.5–5.1)
Sodium: 138 mmol/L (ref 135–145)
Total Bilirubin: 0.6 mg/dL (ref 0.0–1.2)
Total Protein: 4.9 g/dL — ABNORMAL LOW (ref 6.5–8.1)

## 2023-09-03 LAB — MAGNESIUM: Magnesium: 1.9 mg/dL (ref 1.7–2.4)

## 2023-09-03 MED ORDER — DEXTROSE 5 % IV SOLN
2.0000 mg | Freq: Once | INTRAVENOUS | Status: AC
  Administered 2023-09-03: 2 mg via INTRAVENOUS
  Filled 2023-09-03: qty 5

## 2023-09-03 MED ORDER — PANTOPRAZOLE SODIUM 40 MG PO TBEC
40.0000 mg | DELAYED_RELEASE_TABLET | Freq: Every day | ORAL | Status: DC
  Administered 2023-09-04 – 2023-09-07 (×4): 40 mg via ORAL
  Filled 2023-09-03 (×4): qty 1

## 2023-09-03 MED ORDER — BUTALBITAL-APAP-CAFFEINE 50-325-40 MG PO TABS
1.0000 | ORAL_TABLET | Freq: Four times a day (QID) | ORAL | Status: DC | PRN
  Administered 2023-09-03 – 2023-09-04 (×3): 1 via ORAL
  Filled 2023-09-03 (×3): qty 1

## 2023-09-03 NOTE — Plan of Care (Signed)
  Problem: Coping: Goal: Level of anxiety will decrease Outcome: Progressing   Problem: Nutrition: Goal: Adequate nutrition will be maintained Outcome: Not Progressing   Problem: Activity: Goal: Risk for activity intolerance will decrease Outcome: Progressing   Problem: Clinical Measurements: Goal: Diagnostic test results will improve Outcome: Progressing   Problem: Education: Goal: Knowledge of General Education information will improve Description: Including pain rating scale, medication(s)/side effects and non-pharmacologic comfort measures Outcome: Progressing

## 2023-09-03 NOTE — Progress Notes (Signed)
 PROGRESS NOTE  [name] FMW:[id number] DOB: 25-Oct-1965 DOA: 08/30/2023 PCP: [facility]   LOS: 4 days   Brief Narrative / Interim history: 58 year old female with gastric bypass surgery 2009, currently not being followed for that anymore, not on any supplementation in terms of vitamins, history of EtOH abuse with at least 4-6 shots of whiskey each day comes into the hospital with nausea, vomiting and poor p.o. intake.  She has been having some abdominal discomfort and symptoms like this for the past few months, but worsening in the last 2 weeks.  Over the last 1 week was unable to eat and drink almost anything.  About 3 days ago she could not drink whiskey so she had to stop.  Experienced slight tremors.  She came to the ED due to persistent symptoms, was found to be hyponatremic, hypokalemic, CT scan of the abdomen pelvis was unremarkable.  She was admitted to the hospital  Subjective / 24h Interval events: She tried to eat some solid food last night, but tells me that her food got stuck in her chest.  She did not mention that to me before, but reports that she has been having dysphagia to solids for few months  Assesement and Plan: Principal Problem:   Abdominal pain Active Problems:   Nausea vomiting and diarrhea   Alcohol abuse   Hyponatremia   Hypokalemia   Essential hypertension  Principal problem Abdominal pain, nausea, vomiting, suspect acute pancreatitis, dysphagia-patient was found to have lipase elevated more than 3 times upper limit of normal, also has epigastric pain with nausea and vomiting.  CT scan did not show acute pancreatitis, however she is very dehydrated and may not be initially apparent.  She, however, met 2 out of 3 criteria for acute pancreatitis - Epigastric pain better, left lower quadrant pain still there - She reports dysphagia to solids for the past 2 to 3 months, did not report that to me before.  GI consulted, appreciate input  Active  problems Elevated LFTs-likely from alcoholic hepatitis.  LFTs improving  Hyponatremia-in the setting of dehydration, sodium now normalized  Hypokalemia-potassium normalized today, magnesium  normal  Hypomagnesemia-mag normal  Numbness, tingling, copper  deficiency-has been going on for the past few days.  B12 was low normal, copper  levels showed deficiency.  Replenish both  History of gastric bypass surgery-will need at least to go back on a multivitamin upon discharge  Alcohol abuse-on CIWA, does not appear to be having significant withdrawals currently  History of hypertension-she is hypotensive here, likely dehydrated, hold home agents  Scheduled Meds:  vitamin B-12  1,000 mcg Oral Daily   enoxaparin  (LOVENOX ) injection  40 mg Subcutaneous Q24H   folic acid   1 mg Oral Daily   LORazepam   0-4 mg Intravenous Q8H   multivitamin with minerals  1 tablet Oral Daily   pantoprazole  (PROTONIX ) IV  40 mg Intravenous Q24H   thiamine   100 mg Oral Daily   Or   thiamine   100 mg Intravenous Daily   Continuous Infusions:  copper  chloride 2 mg in dextrose  5 % 100 mL IVPB     promethazine  (PHENERGAN ) injection (IM or IVPB)     PRN Meds:.butalbital -acetaminophen -caffeine , labetalol , ondansetron  (ZOFRAN ) IV, oxyCODONE , promethazine  **OR** promethazine  (PHENERGAN ) injection (IM or IVPB) **OR** promethazine   Current Outpatient Medications  Medication Instructions   amLODipine (NORVASC) 5 mg, Oral, Daily   triamterene-hydrochlorothiazide (MAXZIDE-25) 37.5-25 MG tablet 1 tablet, Oral, Daily    Diet Orders (From admission, onward)  Start     Ordered   09/02/23 0802  Diet regular Fluid consistency: Thin  Diet effective now       Question:  Fluid consistency:  Answer:  Thin   09/02/23 0801            DVT prophylaxis: enoxaparin  (LOVENOX ) injection 40 mg Start: 08/31/23 1000   Lab Results  Component Value Date   PLT 126 (L) 09/02/2023      Code Status: Full Code  Family  Communication: No family at bedside  Status is: Inpatient Remains inpatient appropriate because: Severity of illness   Level of care: Telemetry Medical  Consultants:  None  Objective: Vitals:   09/03/23 0500 09/03/23 0507 09/03/23 0748 09/03/23 0832  BP: 125/87 125/87 129/83   Pulse: 89 89 96 (!) 0  Resp:   14   Temp:   99.2 F (37.3 C)   TempSrc:   Oral   SpO2:   94%   Weight:      Height:        Intake/Output Summary (Last 24 hours) at 09/03/2023 0945 Last data filed at 09/03/2023 9177 Gross per 24 hour  Intake 580 ml  Output --  Net 580 ml   Wt Readings from Last 3 Encounters:  08/30/23 57.2 kg  05/22/22 61.2 kg  06/18/20 58.1 kg    Examination:  Constitutional: NAD Eyes: lids and conjunctivae normal, no scleral icterus ENMT: mmm Neck: normal, supple Respiratory: clear to auscultation bilaterally, no wheezing, no crackles.  Cardiovascular: Regular rate and rhythm, no murmurs / rubs / gallops.  Abdomen: soft, no distention. Bowel sounds positive.   Data Reviewed: I have independently reviewed following labs and imaging studies   CBC Recent Labs  Lab 08/30/23 1448 08/31/23 0040 08/31/23 0426 09/01/23 0514 09/02/23 1154  WBC 6.9 4.1 3.9* 3.1* 2.6*  HGB 16.9* 12.8 12.4 11.1* 11.8*  HCT 46.8* 34.9* 34.3* 32.9* 35.2*  PLT 254 162 151 142* 126*  MCV 91.2 91.8 91.7 97.6 99.4  MCH 32.9 33.7 33.2 32.9 33.3  MCHC 36.1* 36.7* 36.2* 33.7 33.5  RDW 12.2 12.4 12.5 12.8 13.1  LYMPHSABS 1.8  --  1.5  --   --   MONOABS 0.7  --  0.4  --   --   EOSABS 0.0  --  0.0  --   --   BASOSABS 0.0  --  0.0  --   --     Recent Labs  Lab 08/30/23 1448 08/30/23 1649 08/30/23 1935 08/31/23 0426 08/31/23 0810 09/01/23 0514 09/02/23 0642 09/03/23 0656  NA 129*  --    < > 130* 131* 134* 136 138  K 2.6*  --    < > 3.3* 3.2* 4.0 3.6 4.1  CL 80*  --    < > 94* 93* 98 100 100  CO2 23  --    < > 28 28 27 24 29   GLUCOSE 150*  --    < > 142* 121* 134* 94 103*  BUN 16  --     < > 7 7 6  <5* <5*  CREATININE 1.01*  --    < > 0.77 0.68 0.67 0.59 0.64  CALCIUM 9.2  --    < > 8.0* 8.2* 8.1* 8.3* 8.8*  AST 70*  --   --  50*  --  35  --  43*  ALT 38  --   --  29  --  27  --  36  ALKPHOS 90  --   --  59  --  49  --  50  BILITOT 2.5*  --   --  0.9  --  0.3  --  0.6  ALBUMIN 3.4*  --   --  2.3*  --  2.4*  --  2.6*  MG  --  1.7  --   --   --  1.6* 1.6* 1.9  TSH  --   --   --   --  1.874  --   --   --    < > = values in this interval not displayed.    ------------------------------------------------------------------------------------------------------------------ No results for input(s): CHOL, HDL, LDLCALC, TRIG, CHOLHDL, LDLDIRECT in the last 72 hours.  No results found for: HGBA1C ------------------------------------------------------------------------------------------------------------------ No results for input(s): TSH, T4TOTAL, T3FREE, THYROIDAB in the last 72 hours.  Invalid input(s): FREET3   Cardiac Enzymes No results for input(s): CKMB, TROPONINI, MYOGLOBIN in the last 168 hours.  Invalid input(s): CK ------------------------------------------------------------------------------------------------------------------ No results found for: BNP  CBG: No results for input(s): GLUCAP in the last 168 hours.  Recent Results (from the past 240 hours)  Gastrointestinal Panel by PCR , Stool     Status: Abnormal   Collection Time: 08/31/23  6:16 AM   Specimen: Stool  Result Value Ref Range Status   Campylobacter species NOT DETECTED NOT DETECTED Final   Plesimonas shigelloides NOT DETECTED NOT DETECTED Final   Salmonella species NOT DETECTED NOT DETECTED Final   Yersinia enterocolitica NOT DETECTED NOT DETECTED Final   Vibrio species NOT DETECTED NOT DETECTED Final   Vibrio cholerae NOT DETECTED NOT DETECTED Final   Enteroaggregative E coli (EAEC) NOT DETECTED NOT DETECTED Final   Enteropathogenic E coli (EPEC) DETECTED  (A) NOT DETECTED Final    Comment: CRITICAL RESULT CALLED TO, READ BACK BY AND VERIFIED WITH: KELLY TRUONG RN 09/01/23 1017 KG    Enterotoxigenic E coli (ETEC) NOT DETECTED NOT DETECTED Final   Shiga like toxin producing E coli (STEC) NOT DETECTED NOT DETECTED Final   Shigella/Enteroinvasive E coli (EIEC) NOT DETECTED NOT DETECTED Final   Cryptosporidium NOT DETECTED NOT DETECTED Final   Cyclospora cayetanensis NOT DETECTED NOT DETECTED Final   Entamoeba histolytica NOT DETECTED NOT DETECTED Final   Giardia lamblia NOT DETECTED NOT DETECTED Final   Adenovirus F40/41 DETECTED (A) NOT DETECTED Final   Astrovirus NOT DETECTED NOT DETECTED Final   Norovirus GI/GII NOT DETECTED NOT DETECTED Final   Rotavirus A NOT DETECTED NOT DETECTED Final   Sapovirus (I, II, IV, and V) NOT DETECTED NOT DETECTED Final    Comment: Performed at [facility], [address] [place], [place] [address]  C Difficile Quick Screen w PCR reflex     Status: None   Collection Time: 08/31/23  6:16 AM   Specimen: STOOL  Result Value Ref Range Status   C Diff antigen NEGATIVE NEGATIVE Final   C Diff toxin NEGATIVE NEGATIVE Final   C Diff interpretation No C. difficile detected.  Final    Comment: Performed at [facility], [address], [place], [place] [address]     Radiology Studies: No results found.     Nilda Fendt, MD, PhD [facility]  Between 7 am - 7 pm I am available, please contact me via Amion (for emergencies) or Securechat (non urgent messages)  Between 7 pm - 7 am I am not available, please contact night coverage MD/APP via Amion

## 2023-09-03 NOTE — Evaluation (Signed)
 Physical Therapy Evaluation Patient Details Name: [name] MRN: [id number] DOB: [birth year] Today's Date: 09/03/2023  History of Present Illness  58 yo female arrives to [facility] ED on 08/30/23 with c/o SOB, nausea, vomiting, and paresthesias. CT abdomen pelvis and chest x-ray show nothing acute. Suspect for acute pancreatitis but pt was dehydrated. PMH: EtOH abuse, gastric bypass (2009),  Clinical Impression  Pt presents with decreased balance, mobility, strength, and activity tolerance. Pt to benefit from acute PT to address deficits. Pt ambulated short household distance using a RW with no significant LOB but ambulated better utilizing a rollator. PT to benefit from a rollator for energy conservation for longer bouts of gait and reduce the risk of falls. Pt to benefit from further PT to address deficits and return to independence. PT to progress mobility as tolerated, and will continue to follow acutely.         If plan is discharge home, recommend the following: A little help with walking and/or transfers;Assistance with cooking/housework;Assist for transportation;A little help with bathing/dressing/bathroom   Can travel by private vehicle        Equipment Recommendations Rollator (4 wheels)  Recommendations for Other Services       Functional Status Assessment Patient has had a recent decline in their functional status and demonstrates the ability to make significant improvements in function in a reasonable and predictable amount of time.     Precautions / Restrictions Precautions Precautions: Fall Restrictions Weight Bearing Restrictions Per Provider Order: No      Mobility  Bed Mobility Overal bed mobility: Needs Assistance             General bed mobility comments: In recliner at start and end of session    Transfers Overall transfer level: Needs assistance Equipment used: Rolling walker (2 wheels), Rollator (4 wheels) Transfers: Sit to/from Stand Sit to Stand:  Contact guard assist           General transfer comment: For safety. Sit to standx2, recliner and toilet (use of grab bars)    Ambulation/Gait Ambulation/Gait assistance: Supervision Gait Distance (Feet): 100 Feet (+10 ft using rollator) Assistive device: Rolling walker (2 wheels), Rollator (4 wheels) Gait Pattern/deviations: Step-through pattern, Decreased stride length, Shuffle Gait velocity: Decreased     General Gait Details: Cues for using RW: staying inside walker, upright and to not watch feet, taking big steps. Educated pt on use of rollator from bathroom: brakes, parking  Acupuncturist Bed    Modified Rankin (Stroke Patients Only)       Balance Overall balance assessment: Needs assistance Sitting-balance support: Bilateral upper extremity supported, Feet supported Sitting balance-Leahy Scale: Good     Standing balance support: Bilateral upper extremity supported, During functional activity, Reliant on assistive device for balance Standing balance-Leahy Scale: Fair Standing balance comment: Will to try grab onto furniture when not using AD. Reliant on AD (RW or rollator) for ambulation                             Pertinent Vitals/Pain Pain Assessment Pain Assessment: Faces Faces Pain Scale: Hurts little more Pain Location: Abdomen Pain Descriptors / Indicators: Aching, Grimacing Pain Intervention(s): Monitored during session, Limited activity within patient's tolerance    Home Living Family/patient expects to be discharged to:: Private residence Living Arrangements: Non-relatives/Friends   Type of Home: House Home Access:  Level entry       Home Layout: One level Home Equipment: Tub bench      Prior Function Prior Level of Function : Independent/Modified Independent;Driving;History of Falls (last six months)                     Extremity/Trunk Assessment   Upper Extremity  Assessment Upper Extremity Assessment: Generalized weakness    Lower Extremity Assessment Lower Extremity Assessment: Generalized weakness (At least 4/5 bilaterally)    Cervical / Trunk Assessment Cervical / Trunk Assessment: Normal  Communication   Communication Communication: No apparent difficulties    Cognition Arousal: Alert Behavior During Therapy: Flat affect   PT - Cognitive impairments: No apparent impairments                         Following commands: Intact       Cueing Cueing Techniques: Verbal cues, Tactile cues, Gestural cues     General Comments General comments (skin integrity, edema, etc.): VSS on RA. Pt reported dizziness was positional changed: BP was 87/70 seated and 94/72 standing    Exercises General Exercises - Lower Extremity Long Arc Quad: AROM, Both, 10 reps, Seated   Assessment/Plan    PT Assessment Patient needs continued PT services  PT Problem List Decreased strength;Decreased activity tolerance;Decreased balance;Decreased mobility;Decreased knowledge of use of DME       PT Treatment Interventions Gait training;DME instruction;Functional mobility training;Therapeutic activities;Therapeutic exercise    PT Goals (Current goals can be found in the Care Plan section)  Acute Rehab PT Goals Patient Stated Goal: Return to independent PT Goal Formulation: With patient Time For Goal Achievement: 09/17/23 Potential to Achieve Goals: Good    Frequency Min 2X/week     Co-evaluation               AM-PAC PT 6 Clicks Mobility  Outcome Measure Help needed turning from your back to your side while in a flat bed without using bedrails?: A Little Help needed moving from lying on your back to sitting on the side of a flat bed without using bedrails?: A Little Help needed moving to and from a bed to a chair (including a wheelchair)?: A Little Help needed standing up from a chair using your arms (e.g., wheelchair or bedside  chair)?: A Little Help needed to walk in hospital room?: A Little Help needed climbing 3-5 steps with a railing? : A Lot 6 Click Score: 17    End of Session Equipment Utilized During Treatment: Gait belt Activity Tolerance: Patient tolerated treatment well Patient left: in chair;with call bell/phone within reach;with chair alarm set Nurse Communication: Mobility status PT Visit Diagnosis: Unsteadiness on feet (R26.81);Muscle weakness (generalized) (M62.81);History of falling (Z91.81)    Time: 9089-9047 PT Time Calculation (min) (ACUTE ONLY): 42 min   Charges:   PT Evaluation $PT Eval Low Complexity: 1 Low PT Treatments $Gait Training: 8-22 mins $Therapeutic Activity: 8-22 mins PT General Charges $$ ACUTE PT VISIT: 1 Visit         Quintin Campi, SPT  Acute Rehab  [phone]   Quintin Campi 09/03/2023, 10:44 AM

## 2023-09-03 NOTE — Consult Note (Signed)
 Reason for Consult:Dysphagia Referring Physician: [facility]  [name] [name] Fair HPI: This is a 58 year old female with a PMH of ETOH abuse, alcoholic pancreatitis, and s/p Roux-en-Y gastric bypass in 2009 originally admitted for ETOH pancreatitis.  With supportive care she improved and she was about to be discharged today when she complained about dysphagia.  Per her report her dysphagia started 4 months ago and it was progressive over this time.  When she drinks clear liquids she will not have any problems, but solid foods are an issue for her.  She states that she lost 20 lbs as a result of her symptoms.  Even eating soft foods a struggle for her.  She does not report any issues with GERD and there is no family history of esophageal cancer.  Many years ago she did smoke.  The patient denies any hematemesis.  She feels as if food and pills will lodge in the sternal notch.  There are many times she feels phlegm in that area and she is not able to clear the mucus.  Past Medical History:  Diagnosis Date   Alcohol induced acute pancreatitis    ETOH abuse    Hypertension     Past Surgical History:  Procedure Laterality Date   ABDOMINAL SURGERY     CHOLECYSTECTOMY     GASTRIC BYPASS      Family History  Family history unknown: Yes    Social History:  reports that she has quit smoking. Her smoking use included cigarettes. She has never used smokeless tobacco. She reports current alcohol use of about 35.0 standard drinks of alcohol per week. She reports that she does not use drugs.  Allergies:  Allergies  Allergen Reactions   Xylocaine [Lidocaine Hcl]     seizure   Morphine      Medications: Scheduled:  vitamin B-12  1,000 mcg Oral Daily   enoxaparin  (LOVENOX ) injection  40 mg Subcutaneous Q24H   folic acid   1 mg Oral Daily   LORazepam   0-4 mg Intravenous Q8H   multivitamin with minerals  1 tablet Oral Daily   [START ON 09/04/2023] pantoprazole   40 mg Oral Daily   thiamine   100 mg  Oral Daily   Or   thiamine   100 mg Intravenous Daily   Continuous:  promethazine  (PHENERGAN ) injection (IM or IVPB)      Results for orders placed or performed during the hospital encounter of 08/30/23 (from the past 24 hours)  Comprehensive metabolic panel with GFR     Status: Abnormal   Collection Time: 09/03/23  6:56 AM  Result Value Ref Range   Sodium 138 135 - 145 mmol/L   Potassium 4.1 3.5 - 5.1 mmol/L   Chloride 100 98 - 111 mmol/L   CO2 29 22 - 32 mmol/L   Glucose, Bld 103 (H) 70 - 99 mg/dL   BUN <5 (L) 6 - 20 mg/dL   Creatinine, Ser 9.35 0.44 - 1.00 mg/dL   Calcium 8.8 (L) 8.9 - 10.3 mg/dL   Total Protein 4.9 (L) 6.5 - 8.1 g/dL   Albumin 2.6 (L) 3.5 - 5.0 g/dL   AST 43 (H) 15 - 41 U/L   ALT 36 0 - 44 U/L   Alkaline Phosphatase 50 38 - 126 U/L   Total Bilirubin 0.6 0.0 - 1.2 mg/dL   GFR, Estimated >39 >39 mL/min   Anion gap 9 5 - 15  Magnesium      Status: None   Collection Time: 09/03/23  6:56  AM  Result Value Ref Range   Magnesium  1.9 1.7 - 2.4 mg/dL     No results found.  ROS:  As stated above in the HPI otherwise negative.  Blood pressure 115/73, pulse 86, temperature 98.9 F (37.2 C), temperature source Oral, resp. rate 14, height 5' 2 (1.575 m), weight 57.2 kg, SpO2 99%.    PE: Gen: NAD, Alert and Oriented HEENT:  [place]/AT, EOMI Neck: Supple, no LAD Lungs: CTA Bilaterally CV: RRR without M/G/R ABD: Soft, NTND, +BS Ext: No C/C/E  Assessment/Plan: 1) Dysphagia. 2) ETOH pancreatitis - resolved. 3) Weight loss. 4) S/p Roux-en-Y gastric bypass.   There is a possibility that she has a cervical web as she specifically points to the sternal notch region.  Further evaluation with an EGD will be performed with possible dilation.  Plan: 1) EGD with dilation with Dr. Suzann tomorrow.  Winnifred Dufford D 09/03/2023, 4:13 PM

## 2023-09-03 NOTE — Evaluation (Addendum)
 Occupational Therapy Evaluation Patient Details Name: [name] MRN: [id number] DOB: [birth year] Today's Date: 09/03/2023   History of Present Illness   58 yo female arrives to [facility] ED on 08/30/23 with c/o SOB, nausea, vomiting, and paresthesias. CT abdomen pelvis and chest x-ray show nothing acute. Suspect for acute pancreatitis but pt was dehydrated. PMH: EtOH abuse, gastric bypass (2009),     Clinical Impressions PTA, pt lived with a roommate and was independent in ADL and IADL; pt endorses use of rest breaks and compensatory techniques with decreased activity tolerance since February. Upon eval, pt needing up to C[facility] for BADL. Suspect pt will progress well; anticipate dc home with no OT follow up.     If plan is discharge home, recommend the following:   A little help with walking and/or transfers;A little help with bathing/dressing/bathroom;Assistance with cooking/housework;Assist for transportation;Help with stairs or ramp for entrance     Functional Status Assessment   Patient has had a recent decline in their functional status and demonstrates the ability to make significant improvements in function in a reasonable and predictable amount of time.     Equipment Recommendations   Other (comment) (RW vs rollator)     Recommendations for Other Services         Precautions/Restrictions   Precautions Precautions: Fall Recall of Precautions/Restrictions: Intact (although noncompliant at times with pt trying to get out of bed or chair without staff assist) Restrictions Weight Bearing Restrictions Per Provider Order: No     Mobility Bed Mobility Overal bed mobility: Needs Assistance Bed Mobility: Sit to Supine       Sit to supine: Supervision        Transfers Overall transfer level: Needs assistance Equipment used: Rolling walker (2 wheels) Transfers: Sit to/from Stand Sit to Stand: Contact guard assist           General transfer comment: for  safety, approaching supervision      Balance Overall balance assessment: Needs assistance Sitting-balance support: Bilateral upper extremity supported, Feet supported Sitting balance-Leahy Scale: Good     Standing balance support: Bilateral upper extremity supported, During functional activity, Reliant on assistive device for balance Standing balance-Leahy Scale: Fair Standing balance comment: Will to try grab onto furniture when not using AD. Reliant on AD (RW or rollator) for ambulation                           ADL either performed or assessed with clinical judgement   ADL Overall ADL's : Needs assistance/impaired Eating/Feeding: Independent;Sitting   Grooming: Supervision/safety;Standing   Upper Body Bathing: Supervision/ safety;Standing Upper Body Bathing Details (indicate cue type and reason): at sink Lower Body Bathing: Contact guard assist;Sit to/from stand   Upper Body Dressing : Set up;Sitting   Lower Body Dressing: Contact guard assist;Sit to/from stand   Toilet Transfer: Contact guard assist;Ambulation;Rolling walker (2 wheels)   Toileting- Clothing Manipulation and Hygiene: Contact guard assist;Sit to/from stand       Functional mobility during ADLs: Contact guard assist;Rolling walker (2 wheels)       Vision Baseline Vision/History: 1 Wears glasses Patient Visual Report: No change from baseline Vision Assessment?: No apparent visual deficits     Perception         Praxis         Pertinent Vitals/Pain Pain Assessment Pain Assessment: Faces Faces Pain Scale: Hurts little more Pain Location: Abdomen Pain Descriptors / Indicators: Aching, Grimacing Pain Intervention(s): Limited activity  within patient's tolerance     Extremity/Trunk Assessment Upper Extremity Assessment Upper Extremity Assessment: Generalized weakness   Lower Extremity Assessment Lower Extremity Assessment: Generalized weakness   Cervical / Trunk  Assessment Cervical / Trunk Assessment: Normal   Communication Communication Communication: No apparent difficulties   Cognition Arousal: Alert Behavior During Therapy: Flat affect Cognition: Cognition impaired     Awareness: Intellectual awareness intact (decr safety awareness) Memory impairment (select all impairments): Short-term memory Attention impairment (select first level of impairment): Selective attention   OT - Cognition Comments: cues for safety with mobility intermittently                 Following commands: Intact       Cueing  General Comments   Cueing Techniques: Verbal cues;Tactile cues;Gestural cues  VSS on RA   Exercises     Shoulder Instructions      Home Living Family/patient expects to be discharged to:: Private residence Living Arrangements: Non-relatives/Friends   Type of Home: House Home Access: Level entry     Home Layout: One level     Bathroom Shower/Tub: Chief Strategy Officer: Standard Bathroom Accessibility: Yes   Home Equipment: Tub bench          Prior Functioning/Environment Prior Level of Function : Independent/Modified Independent;Driving;History of Falls (last six months)               ADLs Comments: pt reports independent but decr activity tolerance with increased need for rest breaks since february    OT Problem List: Decreased strength;Decreased activity tolerance;Impaired balance (sitting and/or standing);Decreased safety awareness;Decreased knowledge of use of DME or AE   OT Treatment/Interventions: Self-care/ADL training;Therapeutic exercise;DME and/or AE instruction;Therapeutic activities;Patient/family education;Balance training      OT Goals(Current goals can be found in the care plan section)   Acute Rehab OT Goals Patient Stated Goal: get better OT Goal Formulation: With patient Time For Goal Achievement: 09/17/23 Potential to Achieve Goals: Good   OT Frequency:  Min  2X/week    Co-evaluation              AM-PAC OT 6 Clicks Daily Activity     Outcome Measure Help from another person eating meals?: None Help from another person taking care of personal grooming?: A Little Help from another person toileting, which includes using toliet, bedpan, or urinal?: A Little Help from another person bathing (including washing, rinsing, drying)?: A Little Help from another person to put on and taking off regular upper body clothing?: A Little Help from another person to put on and taking off regular lower body clothing?: A Little 6 Click Score: 19   End of Session Equipment Utilized During Treatment: Gait belt;Rolling walker (2 wheels) Nurse Communication: Mobility status  Activity Tolerance: Patient tolerated treatment well Patient left: in bed;with bed alarm set;with call bell/phone within reach  OT Visit Diagnosis: Unsteadiness on feet (R26.81);Muscle weakness (generalized) (M62.81);Other symptoms and signs involving cognitive function                Time: 8471-8397 OT Time Calculation (min): 34 min Charges:  OT General Charges $OT Visit: 1 Visit OT Evaluation $OT Eval Low Complexity: 1 Low OT Treatments $Self Care/Home Management : 8-22 mins  Elma JONETTA Lebron FREDERICK, OTR/L [facility] Acute Rehabilitation Office: [phone]   Elma JONETTA Lebron 09/03/2023, 4:54 PM

## 2023-09-04 ENCOUNTER — Inpatient Hospital Stay (HOSPITAL_COMMUNITY): Payer: Self-pay

## 2023-09-04 ENCOUNTER — Encounter (HOSPITAL_COMMUNITY): Payer: Self-pay | Admitting: Internal Medicine

## 2023-09-04 ENCOUNTER — Encounter (HOSPITAL_COMMUNITY)
Admission: EM | Disposition: A | Discharge status: Home / Self Care | Payer: Self-pay | Source: Home / Self Care | Attending: Internal Medicine

## 2023-09-04 DIAGNOSIS — R1310 Dysphagia, unspecified: Secondary | ICD-10-CM

## 2023-09-04 DIAGNOSIS — Z87891 Personal history of nicotine dependence: Secondary | ICD-10-CM

## 2023-09-04 DIAGNOSIS — I10 Essential (primary) hypertension: Secondary | ICD-10-CM | POA: Diagnosis not present

## 2023-09-04 DIAGNOSIS — Q399 Congenital malformation of esophagus, unspecified: Secondary | ICD-10-CM

## 2023-09-04 DIAGNOSIS — Z9884 Bariatric surgery status: Secondary | ICD-10-CM

## 2023-09-04 DIAGNOSIS — R109 Unspecified abdominal pain: Secondary | ICD-10-CM | POA: Diagnosis not present

## 2023-09-04 HISTORY — PX: ESOPHAGOGASTRODUODENOSCOPY: SHX5428

## 2023-09-04 LAB — COMPREHENSIVE METABOLIC PANEL WITH GFR
ALT: 31 U/L (ref 0–44)
AST: 39 U/L (ref 15–41)
Albumin: 2.7 g/dL — ABNORMAL LOW (ref 3.5–5.0)
Alkaline Phosphatase: 56 U/L (ref 38–126)
Anion gap: 13 (ref 5–15)
BUN: <5 mg/dL — ABNORMAL LOW (ref 6–20)
CO2: 25 mmol/L (ref 22–32)
Calcium: 8.7 mg/dL — ABNORMAL LOW (ref 8.9–10.3)
Chloride: 101 mmol/L (ref 98–111)
Creatinine, Ser: 0.62 mg/dL (ref 0.44–1.00)
GFR, Estimated: >60 mL/min (ref >60)
Glucose, Bld: 95 mg/dL (ref 70–99)
Potassium: 4.1 mmol/L (ref 3.5–5.1)
Sodium: 139 mmol/L (ref 135–145)
Total Bilirubin: 0.7 mg/dL (ref 0.0–1.2)
Total Protein: 4.9 g/dL — ABNORMAL LOW (ref 6.5–8.1)

## 2023-09-04 LAB — CBC
HCT: 36.8 % (ref 36.0–46.0)
Hemoglobin: 12.1 g/dL (ref 12.0–15.0)
MCH: 33.2 pg (ref 26.0–34.0)
MCHC: 32.9 g/dL (ref 30.0–36.0)
MCV: 101.1 fL — ABNORMAL HIGH (ref 80.0–100.0)
Platelets: 160 K/uL (ref 150–400)
RBC: 3.64 MIL/uL — ABNORMAL LOW (ref 3.87–5.11)
RDW: 13.2 % (ref 11.5–15.5)
WBC: 2 K/uL — ABNORMAL LOW (ref 4.0–10.5)
nRBC: 0 % (ref 0.0–0.2)

## 2023-09-04 LAB — MAGNESIUM: Magnesium: 1.7 mg/dL (ref 1.7–2.4)

## 2023-09-04 SURGERY — EGD (ESOPHAGOGASTRODUODENOSCOPY)
Anesthesia: Monitor Anesthesia Care

## 2023-09-04 MED ORDER — COPPER 2 MG PO TABS
2.0000 mg | Freq: Every day | Status: DC
  Administered 2023-09-04 – 2023-09-07 (×4): 2 mg via ORAL
  Filled 2023-09-04 (×4): qty 1

## 2023-09-04 MED ORDER — SODIUM CHLORIDE 0.9 % IV SOLN: INTRAVENOUS | Status: DC

## 2023-09-04 MED ORDER — PROPOFOL 10 MG/ML IV BOLUS
INTRAVENOUS | Status: DC | PRN
Start: 2023-09-04 — End: 2023-09-04
  Administered 2023-09-04: 80 mg via INTRAVENOUS
  Administered 2023-09-04 (×2): 20 mg via INTRAVENOUS
  Administered 2023-09-04: 30 mg via INTRAVENOUS
  Administered 2023-09-04 (×2): 20 mg via INTRAVENOUS

## 2023-09-04 MED ORDER — ASPIRIN-ACETAMINOPHEN-CAFFEINE 250-250-65 MG PO TABS
1.0000 | ORAL_TABLET | Freq: Three times a day (TID) | ORAL | Status: DC | PRN
  Administered 2023-09-04 – 2023-09-05 (×3): 1 via ORAL
  Filled 2023-09-04 (×5): qty 1

## 2023-09-04 NOTE — Transfer of Care (Signed)
 Immediate Anesthesia Transfer of Care Note  Patient: [name]  Procedure(s) Performed: EGD (ESOPHAGOGASTRODUODENOSCOPY)  Patient Location: PACU and Endoscopy Unit  Anesthesia Type:MAC  Level of Consciousness: awake and alert   Airway & Oxygen Therapy: Patient Spontanous Breathing and Patient connected to face mask oxygen  Post-op Assessment: Report given to RN and Post -op Vital signs reviewed and stable  Post vital signs: Reviewed and stable  Last Vitals:  Vitals Value Taken Time  BP 136/88 09/04/23 11:40  Temp    Pulse 74 09/04/23 11:40  Resp 20 09/04/23 11:40  SpO2 100 % 09/04/23 11:40  Vitals shown include unfiled device data.  Last Pain:  Vitals:   09/04/23 1044  TempSrc: Temporal  PainSc:       Patients Stated Pain Goal: 8 (09/04/23 1044)  Complications: No notable events documented.

## 2023-09-04 NOTE — Op Note (Signed)
 [facility] Patient Name: [name] Procedure Date : 09/04/2023 MRN: [id number] Attending MD: [name] , MD, [id number] Date of Birth: May 16, 1965 CSN: [id number] Age: 58 Admit Type: Inpatient Procedure:                Upper GI endoscopy Indications:              Dysphagia to solids, History of Roux-en-Y gastric                            bypass Providers:                [name], MD, Ozell Pouch, [place]y [place], Technician Referring MD:              Medicines:                Monitored Anesthesia Care Complications:            No immediate complications. Estimated blood loss:                            Minimal. Estimated Blood Loss:     Estimated blood loss was minimal. Procedure:                Pre-Anesthesia Assessment:                           - Prior to the procedure, a History and Physical                            was performed, and patient medications and                            allergies were reviewed. The patient's tolerance of                            previous anesthesia was also reviewed. The risks                            and benefits of the procedure and the sedation                            options and risks were discussed with the patient.                            All questions were answered, and informed consent                            was obtained. Prior Anticoagulants: The patient has                            taken no anticoagulant or antiplatelet agents. ASA                            Grade Assessment: III -  A patient with severe                            systemic disease. After reviewing the risks and                            benefits, the patient was deemed in satisfactory                            condition to undergo the procedure.                           After obtaining informed consent, the endoscope was                            passed under direct vision. Throughout the                             procedure, the patient's blood pressure, pulse, and                            oxygen saturations were monitored continuously. The                            [id number]-H190 ([id number]) Olympus endoscope was introduced                            through the mouth, and advanced to the jejunum. The                            upper GI endoscopy was accomplished without                            difficulty. The patient tolerated the procedure                            well. Scope In: Scope Out: Findings:      Normal mucosa was found in the entire esophagus. Biopsies were obtained       from the proximal and distal esophagus with cold forceps for histology       for evaluation of eosinophilic esophagitis.      The distal esophagus was moderately tortuous.      No endoscopic abnormality was evident in the esophagus to explain the       patient's complaint of dysphagia with the exception of tortuous distal       esophagus. A guidewire was placed and the scope was withdrawn. Dilation       was performed with a Savary dilator with no resistance at 15 mm and mild       resistance at 16 mm. Placement of guidewire was challenging due to small       gastric pouch.      Evidence of a gastric bypass was found. A gastric pouch with a normal       size was found. The gastrojejunal anastomosis was characterized by       healthy appearing mucosa. This was  traversed.      Both limbs of the jejunum were examined and were normal. Impression:               - Normal mucosa was found in the entire esophagus.                            Biopsies were taken with a cold forceps for                            evaluation of eosinophilic esophagitis.                           - Tortuous esophagus. Esophageal tortuosity could                            be contributing to symptoms of dysphagia.                           - No endoscopic esophageal abnormality to explain                            patient's  dysphagia with the exception of                            esophageal tortuosity.. Dilated.                           - Gastric bypass with a normal-sized pouch.                            Gastrojejunal anastomosis characterized by healthy                            appearing mucosa.                           - Normal examined jejunal limbs. Recommendation:           - Return patient to hospital ward for ongoing care.                           - Await pathology results.                           - Advised a soft/liquid diet for next 24 hours                           - If patient does not note improvement in dysphagia                            with Savary dilation can consider trial of dilation                            with balloon dilator in the outpatient setting.  Placement of guidewire was challenging given small                            gastric pouch. Could also consider evaluation for                            esophageal motility disorders with barium                            esophagram and/or esophageal manometry if no                            response to dilation in the future. Procedure Code(s):        --- Professional ---                           [phone], Esophagogastroduodenoscopy, flexible,                            transoral; with insertion of guide wire followed by                            passage of dilator(s) through esophagus over guide                            wire Diagnosis Code(s):        --- Professional ---                           Q39.9, Congenital malformation of esophagus,                            unspecified                           R13.10, Dysphagia, unspecified                           Z98.84, Bariatric surgery status CPT copyright 2022 [employer]. All rights reserved. The codes documented in this report are preliminary and upon coder review may  be revised to meet current compliance  requirements. [name], MD 09/04/2023 11:44:17 AM This report has been signed electronically. Number of Addenda: 0

## 2023-09-04 NOTE — Plan of Care (Signed)
  Problem: Education: Goal: Knowledge of General Education information will improve Description: Including pain rating scale, medication(s)/side effects and non-pharmacologic comfort measures Outcome: Progressing   Problem: Clinical Measurements: Goal: Will remain free from infection Outcome: Progressing Goal: Diagnostic test results will improve Outcome: Progressing   Problem: Activity: Goal: Risk for activity intolerance will decrease Outcome: Progressing   Problem: Nutrition: Goal: Adequate nutrition will be maintained Outcome: Progressing   Problem: Coping: Goal: Level of anxiety will decrease Outcome: Progressing   Problem: Pain Managment: Goal: General experience of comfort will improve and/or be controlled Outcome: Progressing

## 2023-09-04 NOTE — Anesthesia Preprocedure Evaluation (Addendum)
 Anesthesia Evaluation  Patient identified by MRN, date of birth, ID band Patient awake    Reviewed: Allergy & Precautions, H&P , NPO status , Patient's Chart, lab work & pertinent test results  Airway Mallampati: III  TM Distance: >3 FB Neck ROM: Full    Dental no notable dental hx. (+) Teeth Intact, Dental Advisory Given   Pulmonary Patient abstained from smoking., former smoker   Pulmonary exam normal breath sounds clear to auscultation       Cardiovascular hypertension (144/76 preop), Pt. on medications  Rhythm:Regular Rate:Normal     Neuro/Psych negative neurological ROS  negative psych ROS   GI/Hepatic negative GI ROS,,,(+)     substance abuse  alcohol useHx gastric bypass    Endo/Other  negative endocrine ROS    Renal/GU negative Renal ROS  negative genitourinary   Musculoskeletal negative musculoskeletal ROS (+)    Abdominal   Peds  Hematology negative hematology ROS (+) Hb 12.1, plt 160   Anesthesia Other Findings   Reproductive/Obstetrics negative OB ROS                              Anesthesia Physical Anesthesia Plan  ASA: 3  Anesthesia Plan: MAC   Post-op Pain Management:    Induction:   PONV Risk Score and Plan: 2 and Propofol  infusion and TIVA  Airway Management Planned: Natural Airway and Simple Face Mask  Additional Equipment: None  Intra-op Plan:   Post-operative Plan:   Informed Consent: I have reviewed the patients History and Physical, chart, labs and discussed the procedure including the risks, benefits and alternatives for the proposed anesthesia with the patient or authorized representative who has indicated his/her understanding and acceptance.     Dental advisory given  Plan Discussed with: CRNA  Anesthesia Plan Comments:          Anesthesia Quick Evaluation

## 2023-09-04 NOTE — Progress Notes (Signed)
 PROGRESS NOTE  [name] FMW:[id number] DOB: 06-13-1965 DOA: 08/30/2023 PCP: [facility]   LOS: 5 days   Brief Narrative / Interim history: 58 year old female with gastric bypass surgery 2009, currently not being followed for that anymore, not on any supplementation in terms of vitamins, history of EtOH abuse with at least 4-6 shots of whiskey each day comes into the hospital with nausea, vomiting and poor p.o. intake.  She has been having some abdominal discomfort and symptoms like this for the past few months, but worsening in the last 2 weeks.  Over the last 1 week was unable to eat and drink almost anything.  About 3 days ago she could not drink whiskey so she had to stop.  Experienced slight tremors.  She came to the ED due to persistent symptoms, was found to be hyponatremic, hypokalemic, CT scan of the abdomen pelvis was unremarkable.  She was admitted to the hospital  Subjective / 24h Interval events: Ongoing dysphagia last night.  GI plans endoscopy this morning  Assesement and Plan: Principal Problem:   Abdominal pain Active Problems:   Nausea vomiting and diarrhea   Alcohol abuse   Hyponatremia   Hypokalemia   Essential hypertension   Dysphagia  Principal problem Abdominal pain, nausea, vomiting, suspect acute pancreatitis, dysphagia-patient was found to have lipase elevated more than 3 times upper limit of normal, also has epigastric pain with nausea and vomiting.  CT scan did not show acute pancreatitis, however she is very dehydrated and may not be initially apparent.  She, however, met 2 out of 3 criteria for acute pancreatitis - Epigastric pain better, left lower quadrant pain still there - She reports dysphagia to solids for the past 2 to 3 months, did not report that to me before.  GI consulted 9/1, appreciate input, underwent an EGD this morning which showed tortuous esophagus, dilated, but no other acute findings.  Allow full liquid diet this  afternoon, advance to soft as tolerated  Active problems Elevated LFTs-likely from alcoholic hepatitis.  LFTs improving  Hyponatremia-in the setting of dehydration, sodium now normalized  Hypokalemia-potassium normalized today, magnesium  normal  Hypomagnesemia-mag normal  Numbness, tingling, copper  deficiency-has been going on for the past few days.  B12 was low normal, copper  levels showed deficiency.  Replenish both  History of gastric bypass surgery-will need at least to go back on a multivitamin upon discharge  Alcohol abuse-on CIWA, does not appear to be having significant withdrawals currently  History of hypertension-normotensive now, continue to hold home agents.  She has some weight loss so may not need antihypertensives upon discharge  Scheduled Meds:  copper   2 mg Oral Daily   vitamin B-12  1,000 mcg Oral Daily   enoxaparin  (LOVENOX ) injection  40 mg Subcutaneous Q24H   folic acid   1 mg Oral Daily   multivitamin with minerals  1 tablet Oral Daily   pantoprazole   40 mg Oral Daily   thiamine   100 mg Oral Daily   Or   thiamine   100 mg Intravenous Daily   Continuous Infusions:  promethazine  (PHENERGAN ) injection (IM or IVPB) 6.25 mg (09/04/23 0608)   PRN Meds:.aspirin -acetaminophen -caffeine , labetalol , ondansetron  (ZOFRAN ) IV, oxyCODONE , promethazine  **OR** promethazine  (PHENERGAN ) injection (IM or IVPB) **OR** promethazine   Current Outpatient Medications  Medication Instructions   amLODipine (NORVASC) 5 mg, Oral, Daily   triamterene-hydrochlorothiazide (MAXZIDE-25) 37.5-25 MG tablet 1 tablet, Oral, Daily    Diet Orders (From admission, onward)     Start  Ordered   09/04/23 1305  Diet full liquid Room service appropriate? Yes; Fluid consistency: Thin  Diet effective now       Question Answer Comment  Room service appropriate? Yes   Fluid consistency: Thin      09/04/23 1304            DVT prophylaxis: enoxaparin  (LOVENOX ) injection 40 mg Start:  08/31/23 1000   Lab Results  Component Value Date   PLT 160 09/04/2023      Code Status: Full Code  Family Communication: No family at bedside  Status is: Inpatient Remains inpatient appropriate because: Severity of illness   Level of care: Telemetry Medical  Consultants:  None  Objective: Vitals:   09/04/23 1142 09/04/23 1148 09/04/23 1150 09/04/23 1200  BP: 136/88  139/82 104/83  Pulse: 72 75 73 77  Resp: 17 (!) 22 20 16   Temp: (!) 97.5 F (36.4 C)     TempSrc: Temporal     SpO2: 100% 97% 95% 96%  Weight:      Height:        Intake/Output Summary (Last 24 hours) at 09/04/2023 1304 Last data filed at 09/04/2023 1136 Gross per 24 hour  Intake 520 ml  Output --  Net 520 ml   Wt Readings from Last 3 Encounters:  08/30/23 57.2 kg  05/22/22 61.2 kg  06/18/20 58.1 kg    Examination:  Constitutional: NAD Eyes: lids and conjunctivae normal, no scleral icterus ENMT: mmm Neck: normal, supple Respiratory: clear to auscultation bilaterally, no wheezing, no crackles. Normal respiratory effort.  Cardiovascular: Regular rate and rhythm, no murmurs / rubs / gallops. No LE edema. Abdomen: soft, no distention, no tenderness. Bowel sounds positive.   Data Reviewed: I have independently reviewed following labs and imaging studies   CBC Recent Labs  Lab 08/30/23 1448 08/31/23 0040 08/31/23 0426 09/01/23 0514 09/02/23 1154 09/04/23 0620  WBC 6.9 4.1 3.9* 3.1* 2.6* 2.0*  HGB 16.9* 12.8 12.4 11.1* 11.8* 12.1  HCT 46.8* 34.9* 34.3* 32.9* 35.2* 36.8  PLT 254 162 151 142* 126* 160  MCV 91.2 91.8 91.7 97.6 99.4 101.1*  MCH 32.9 33.7 33.2 32.9 33.3 33.2  MCHC 36.1* 36.7* 36.2* 33.7 33.5 32.9  RDW 12.2 12.4 12.5 12.8 13.1 13.2  LYMPHSABS 1.8  --  1.5  --   --   --   MONOABS 0.7  --  0.4  --   --   --   EOSABS 0.0  --  0.0  --   --   --   BASOSABS 0.0  --  0.0  --   --   --     Recent Labs  Lab 08/30/23 1448 08/30/23 1649 08/30/23 1935 08/31/23 0426  08/31/23 0810 09/01/23 0514 09/02/23 0642 09/03/23 0656 09/04/23 0620  NA 129*  --    < > 130* 131* 134* 136 138 139  K 2.6*  --    < > 3.3* 3.2* 4.0 3.6 4.1 4.1  CL 80*  --    < > 94* 93* 98 100 100 101  CO2 23  --    < > 28 28 27 24 29 25   GLUCOSE 150*  --    < > 142* 121* 134* 94 103* 95  BUN 16  --    < > 7 7 6  <5* <5* <5*  CREATININE 1.01*  --    < > 0.77 0.68 0.67 0.59 0.64 0.62  CALCIUM 9.2  --    < >  8.0* 8.2* 8.1* 8.3* 8.8* 8.7*  AST 70*  --   --  50*  --  35  --  43* 39  ALT 38  --   --  29  --  27  --  36 31  ALKPHOS 90  --   --  59  --  49  --  50 56  BILITOT 2.5*  --   --  0.9  --  0.3  --  0.6 0.7  ALBUMIN 3.4*  --   --  2.3*  --  2.4*  --  2.6* 2.7*  MG  --  1.7  --   --   --  1.6* 1.6* 1.9 1.7  TSH  --   --   --   --  1.874  --   --   --   --    < > = values in this interval not displayed.    ------------------------------------------------------------------------------------------------------------------ No results for input(s): CHOL, HDL, LDLCALC, TRIG, CHOLHDL, LDLDIRECT in the last 72 hours.  No results found for: HGBA1C ------------------------------------------------------------------------------------------------------------------ No results for input(s): TSH, T4TOTAL, T3FREE, THYROIDAB in the last 72 hours.  Invalid input(s): FREET3   Cardiac Enzymes No results for input(s): CKMB, TROPONINI, MYOGLOBIN in the last 168 hours.  Invalid input(s): CK ------------------------------------------------------------------------------------------------------------------ No results found for: BNP  CBG: No results for input(s): GLUCAP in the last 168 hours.  Recent Results (from the past 240 hours)  Gastrointestinal Panel by PCR , Stool     Status: Abnormal   Collection Time: 08/31/23  6:16 AM   Specimen: Stool  Result Value Ref Range Status   Campylobacter species NOT DETECTED NOT DETECTED Final   Plesimonas shigelloides  NOT DETECTED NOT DETECTED Final   Salmonella species NOT DETECTED NOT DETECTED Final   Yersinia enterocolitica NOT DETECTED NOT DETECTED Final   Vibrio species NOT DETECTED NOT DETECTED Final   Vibrio cholerae NOT DETECTED NOT DETECTED Final   Enteroaggregative E coli (EAEC) NOT DETECTED NOT DETECTED Final   Enteropathogenic E coli (EPEC) DETECTED (A) NOT DETECTED Final    Comment: CRITICAL RESULT CALLED TO, READ BACK BY AND VERIFIED WITH: KELLY TRUONG RN 09/01/23 1017 KG    Enterotoxigenic E coli (ETEC) NOT DETECTED NOT DETECTED Final   Shiga like toxin producing E coli (STEC) NOT DETECTED NOT DETECTED Final   Shigella/Enteroinvasive E coli (EIEC) NOT DETECTED NOT DETECTED Final   Cryptosporidium NOT DETECTED NOT DETECTED Final   Cyclospora cayetanensis NOT DETECTED NOT DETECTED Final   Entamoeba histolytica NOT DETECTED NOT DETECTED Final   Giardia lamblia NOT DETECTED NOT DETECTED Final   Adenovirus F40/41 DETECTED (A) NOT DETECTED Final   Astrovirus NOT DETECTED NOT DETECTED Final   Norovirus GI/GII NOT DETECTED NOT DETECTED Final   Rotavirus A NOT DETECTED NOT DETECTED Final   Sapovirus (I, II, IV, and V) NOT DETECTED NOT DETECTED Final    Comment: Performed at [facility], [address] [place], [place] [address]  C Difficile Quick Screen w PCR reflex     Status: None   Collection Time: 08/31/23  6:16 AM   Specimen: STOOL  Result Value Ref Range Status   C Diff antigen NEGATIVE NEGATIVE Final   C Diff toxin NEGATIVE NEGATIVE Final   C Diff interpretation No C. difficile detected.  Final    Comment: Performed at [facility], [address], [place], [place] [address]     Radiology Studies: No results found.     Matti Killingsworth  Trixie, MD, PhD [facility]  Between 7 am - 7 pm I am available, please contact me via Amion (for emergencies) or Securechat (non urgent messages)  Between 7 pm - 7 am I am not available, please contact night coverage  MD/APP via Amion

## 2023-09-04 NOTE — Anesthesia Postprocedure Evaluation (Signed)
 Anesthesia Post Note  Patient: [name]  Procedure(s) Performed: EGD (ESOPHAGOGASTRODUODENOSCOPY)     Patient location during evaluation: PACU Anesthesia Type: MAC Level of consciousness: awake and alert Pain management: pain level controlled Vital Signs Assessment: post-procedure vital signs reviewed and stable Respiratory status: spontaneous breathing, nonlabored ventilation and respiratory function stable Cardiovascular status: stable and blood pressure returned to baseline Postop Assessment: no apparent nausea or vomiting Anesthetic complications: no   No notable events documented.  Last Vitals:  Vitals:   09/04/23 1150 09/04/23 1200  BP: 139/82 104/83  Pulse: 73 77  Resp: 20 16  Temp:    SpO2: 95% 96%    Last Pain:  Vitals:   09/04/23 1200  TempSrc:   PainSc: 0-No pain                 Yahsir Wickens,W. EDMOND

## 2023-09-04 NOTE — H&P (Signed)
 [facility] History and Physical   Primary Care Physician:  [facility]   Reason for Procedure:  Dysphagia  Plan:    Upper endoscopy with possible dilation     HPI: [name] is a 58 y.o. female undergoing upper endoscopy with possible dilation for history of dysphagia.  Patient has a history of alcohol abuse, alcoholic pancreatitis, Roux-en-Y gastric bypass in 2009 and was admitted to the hospital for alcoholic pancreatitis.  Symptomatically improved from that perspective.  Reports history of dysphagia x 4 months predominantly to solids.  Describes 20 pound weight loss related to this.  No history of reflux.  Denies hematemesis.  Also notes some dysphagia to pills.  Has received Lovenox  in the hospital which is currently on hold.   Past Medical History:  Diagnosis Date   Alcohol induced acute pancreatitis    ETOH abuse    Hypertension     Past Surgical History:  Procedure Laterality Date   ABDOMINAL SURGERY     CHOLECYSTECTOMY     GASTRIC BYPASS      Prior to Admission medications   Medication Sig Start Date End Date Taking? Authorizing Provider  amLODipine (NORVASC) 5 MG tablet Take 5 mg by mouth daily. 06/05/23  Yes [provider]  triamterene-hydrochlorothiazide (MAXZIDE-25) 37.5-25 MG tablet Take 1 tablet by mouth daily. 04/21/20  Yes [provider]    Current Facility-Administered Medications  Medication Dose Route Frequency Provider Last Rate Last Admin   0.9 %  sodium chloride  infusion   Intravenous Continuous Rollin Dover, MD 20 mL/hr at 09/04/23 0749 New Bag at 09/04/23 0749   butalbital -acetaminophen -caffeine  (FIORICET ) 50-325-40 MG per tablet 1 tablet  1 tablet Oral Q6H PRN Gherghe, Costin M, MD   1 tablet at 09/04/23 0612   cyanocobalamin  (VITAMIN B12) tablet 1,000 mcg  1,000 mcg Oral Daily Gherghe, Costin M, MD   1,000 mcg at 09/03/23 0818   enoxaparin  (LOVENOX ) injection 40 mg  40 mg Subcutaneous Q24H  Franky Redia SAILOR, MD   40 mg at 09/03/23 0818   folic acid  (FOLVITE ) tablet 1 mg  1 mg Oral Daily Kakrakandy, Arshad N, MD   1 mg at 09/03/23 9182   labetalol  (NORMODYNE ) injection 5 mg  5 mg Intravenous Q4H PRN Kakrakandy, Arshad N, MD       multivitamin with minerals tablet 1 tablet  1 tablet Oral Daily Franky Redia SAILOR, MD   1 tablet at 09/03/23 9182   ondansetron  (ZOFRAN ) injection 4 mg  4 mg Intravenous Q6H PRN Gherghe, Costin M, MD   4 mg at 09/04/23 0745   oxyCODONE  (Oxy IR/ROXICODONE ) immediate release tablet 5-10 mg  5-10 mg Oral Q3H PRN Gherghe, Costin M, MD   10 mg at 09/04/23 0746   pantoprazole  (PROTONIX ) EC tablet 40 mg  40 mg Oral Daily Gherghe, Costin M, MD       promethazine  (PHENERGAN ) tablet 12.5 mg  12.5 mg Oral Q6H PRN Gherghe, Costin M, MD   12.5 mg at 09/03/23 1154   Or   promethazine  (PHENERGAN ) 6.25 mg/NS 50 mL IVPB  6.25 mg Intravenous Q6H PRN Gherghe, Costin M, MD 150 mL/hr at 09/04/23 0608 6.25 mg at 09/04/23 9391   Or   promethazine  (PHENERGAN ) suppository 12.5 mg  12.5 mg Rectal Q6H PRN Gherghe, Costin M, MD       thiamine  (VITAMIN B1) tablet 100 mg  100 mg Oral Daily Franky Redia SAILOR, MD   100 mg at 09/03/23 [phone]  Or   thiamine  (VITAMIN B1) injection 100 mg  100 mg Intravenous Daily Franky Redia SAILOR, MD        Allergies as of 08/30/2023 - Review Complete 08/30/2023  Allergen Reaction Noted   Xylocaine [lidocaine hcl]  12/26/2012   Morphine   05/22/2022    Family History  Family history unknown: Yes    Social History   Socioeconomic History   Marital status: Widowed    Spouse name: Not on file   Number of children: Not on file   Years of education: Not on file   Highest education level: Not on file  Occupational History   Not on file  Tobacco Use   Smoking status: Former    Types: Cigarettes   Smokeless tobacco: Never  Vaping Use   Vaping status: Every Day  Substance and Sexual Activity   Alcohol use: Yes    Alcohol/week: 35.0  standard drinks of alcohol    Types: 35 Shots of liquor per week    Comment: 5 shots whiskey nightly   Drug use: No   Sexual activity: Yes  Other Topics Concern   Not on file  Social History Narrative   Not on file   Social Drivers of Health   Financial Resource Strain: Low Risk  (06/05/2023)   Received from [facility]   Overall Financial Resource Strain (CARDIA)    Difficulty of Paying Living Expenses: Not hard at all  Food Insecurity: No Food Insecurity (08/31/2023)   Hunger Vital Sign    Worried About Running Out of Food in the Last Year: Never true    Ran Out of Food in the Last Year: Never true  Transportation Needs: No Transportation Needs (08/31/2023)   PRAPARE - Administrator, Civil Service (Medical): No    Lack of Transportation (Non-Medical): No  Physical Activity: Not on file  Stress: Not on file  Social Connections: Unknown (05/14/2021)   Received from [facility]   Social Network    Social Network: Not on file  Intimate Partner Violence: Patient Declined (08/31/2023)   Humiliation, Afraid, Rape, and Kick questionnaire    Fear of Current or Ex-Partner: Patient declined    Emotionally Abused: Patient declined    Physically Abused: Patient declined    Sexually Abused: Patient declined    Review of Systems:  All other review of systems negative except as mentioned in the HPI.  Physical Exam: Vital signs BP (!) 144/76 (BP Location: Left Arm)   Pulse 79   Temp 98.9 F (37.2 C) (Oral)   Resp 16   Ht 5' 2 (1.575 m)   Wt 57.2 kg   SpO2 98%   BMI 23.05 kg/m   General:   Alert,  Well-developed, well-nourished, pleasant and cooperative in NAD Lungs:  Clear throughout to auscultation.   Heart:  Regular rate and rhythm; no murmurs, clicks, rubs,  or gallops. Abdomen:  Soft, nontender and nondistended. Normal bowel sounds.   Neuro/Psych:  Normal mood and affect. A and O x 3  [name], MD [facility]

## 2023-09-05 ENCOUNTER — Other Ambulatory Visit (HOSPITAL_COMMUNITY): Payer: Self-pay

## 2023-09-05 DIAGNOSIS — R109 Unspecified abdominal pain: Secondary | ICD-10-CM | POA: Diagnosis not present

## 2023-09-05 DIAGNOSIS — R1110 Vomiting, unspecified: Secondary | ICD-10-CM

## 2023-09-05 LAB — CBC
HCT: 35.2 % — ABNORMAL LOW (ref 36.0–46.0)
Hemoglobin: 11.6 g/dL — ABNORMAL LOW (ref 12.0–15.0)
MCH: 33.7 pg (ref 26.0–34.0)
MCHC: 33 g/dL (ref 30.0–36.0)
MCV: 102.3 fL — ABNORMAL HIGH (ref 80.0–100.0)
Platelets: 150 K/uL (ref 150–400)
RBC: 3.44 MIL/uL — ABNORMAL LOW (ref 3.87–5.11)
RDW: 13.2 % (ref 11.5–15.5)
WBC: 2.7 K/uL — ABNORMAL LOW (ref 4.0–10.5)
nRBC: 0 % (ref 0.0–0.2)

## 2023-09-05 LAB — COMPREHENSIVE METABOLIC PANEL WITH GFR
ALT: 29 U/L (ref 0–44)
AST: 35 U/L (ref 15–41)
Albumin: 2.4 g/dL — ABNORMAL LOW (ref 3.5–5.0)
Alkaline Phosphatase: 54 U/L (ref 38–126)
Anion gap: 9 (ref 5–15)
BUN: <5 mg/dL — ABNORMAL LOW (ref 6–20)
CO2: 25 mmol/L (ref 22–32)
Calcium: 8.6 mg/dL — ABNORMAL LOW (ref 8.9–10.3)
Chloride: 104 mmol/L (ref 98–111)
Creatinine, Ser: 0.59 mg/dL (ref 0.44–1.00)
GFR, Estimated: >60 mL/min (ref >60)
Glucose, Bld: 203 mg/dL — ABNORMAL HIGH (ref 70–99)
Potassium: 3.9 mmol/L (ref 3.5–5.1)
Sodium: 138 mmol/L (ref 135–145)
Total Bilirubin: 0.4 mg/dL (ref 0.0–1.2)
Total Protein: 4.8 g/dL — ABNORMAL LOW (ref 6.5–8.1)

## 2023-09-05 LAB — MAGNESIUM: Magnesium: 1.7 mg/dL (ref 1.7–2.4)

## 2023-09-05 LAB — SURGICAL PATHOLOGY

## 2023-09-05 MED ORDER — FOLIC ACID 1 MG PO TABS
1.0000 mg | ORAL_TABLET | Freq: Every day | ORAL | 0 refills | Status: AC
  Filled 2023-09-05: qty 30, 30d supply, fill #0

## 2023-09-05 MED ORDER — PANTOPRAZOLE SODIUM 40 MG PO TBEC
40.0000 mg | DELAYED_RELEASE_TABLET | Freq: Every day | ORAL | 0 refills | Status: AC
  Filled 2023-09-05: qty 30, 30d supply, fill #0

## 2023-09-05 MED ORDER — POLYETHYLENE GLYCOL 3350 17 G PO PACK
17.0000 g | PACK | Freq: Every day | ORAL | Status: DC
  Administered 2023-09-05 – 2023-09-07 (×3): 17 g via ORAL
  Filled 2023-09-05 (×2): qty 1

## 2023-09-05 MED ORDER — COPPER 2 MG PO TABS
2.0000 mg | Freq: Every day | 0 refills | Status: AC
  Filled 2023-09-05: qty 30, 30d supply, fill #0

## 2023-09-05 MED ORDER — ASPIRIN-ACETAMINOPHEN-CAFFEINE 250-250-65 MG PO TABS
1.0000 | ORAL_TABLET | Freq: Three times a day (TID) | ORAL | 0 refills | Status: AC | PRN
  Filled 2023-09-05: qty 30, 10d supply, fill #0

## 2023-09-05 MED ORDER — CYANOCOBALAMIN 1000 MCG PO TABS
1000.0000 ug | ORAL_TABLET | Freq: Every day | ORAL | 0 refills | Status: AC
  Filled 2023-09-05: qty 30, 30d supply, fill #0

## 2023-09-05 NOTE — Hospital Course (Addendum)
 [name] is a 58 y.o. year old female with PMH of gastric bypass surgery 2009, currently not being followed for that anymore, not on any  vitamin supplement, history of EtOH abuse with at least 4-6 shots of whiskey each day comes into the hospital with nausea, vomiting and poor p.o. intake. Having some abdominal discomfort and symptoms like this for the past few months, but worsening in the last 2 weeks. Over the last 1 week was unable to eat and drink almost anything. About 3 days ago she could not drink whiskey so she had to stop, and she had some tremors.She came to the ED due to persistent symptoms, was found to be hyponatremic, hypokalemic, CT scan of the abdomen pelvis was unremarkable. She was admitted to the hospital.Labs w/ elevated lipase > x3 and w/ epigastric pain with nausea and vomiting. CT scan>>no e/o acute pancreatitis, however she is very dehydrated and may not be initially apparent, however, met 2 out of 3 criteria for acute pancreatitis.  Hence he has been consulted. EGD showed tortuous esophagus, dilated.  But no other acute finding. Continue regular diet, PPI, antiemetics thiamine  multivitamins Copper  folate. Overall doing well,, plan for discharge once tolerating diet She feels ready for discharge home today just had a bowel movement  Subjective: Seen and examined today Overnight no fever Labs ok. She wants to go home this morning no dizziness lightheadedness although BP soft at times but currently 113/72  Discharge diagnosis:   Abdominal pain, nausea, vomiting Acute pancreatitis-alcohol  Progressive dysphagiax 4 months: Labs w/ elevated lipase > x3 and w/ epigastric pain with nausea and vomiting. CT scan>>no e/o acute pancreatitis, however she is very dehydrated and may not be initially apparent, however, met 2 out of 3 criteria for acute pancreatitis.  S/p EGD showed tortuous esophagus, dilated-dysphagia has since improved. Continue PPI antiemetics multivitamins  thiamine , foalte and Copper  GI following closely-not tolerating diet had a bowel movement, she is eager to go home today   Elevated LFTs: likely from alcoholic hepatitis.  Improved    Hypovolemic hyponatremia Hypokalemia Hypomagnesemia: Improved    Numbness, tingling Copper  deficiency: going on for the past few days.  B12 was low normal, copper  levels showed deficiency.  Replacing   History of gastric bypass surgery: will need at least to go back on a multivitamin upon discharge   Alcohol abuse: Continue CIWA, does not appear to be having significant withdrawals currently   Soft blood pressure/hypertension History of hypertension: Currently stable, continue to hold home meds upon discharge, will need to see PCP before resuming.  Continue midodrine  as needed, orthostatic vitals negative, and asymptomatic.  Moderate malnutrition: Recommend diet protein supplement Nutrition Problem: Moderate Malnutrition Etiology: chronic illness Signs/Symptoms: mild muscle depletion, mild fat depletion, energy intake < or equal to 75% for > or equal to 1 month Interventions: Refer to RD note for recommendations   Mobility: PT Orders: Active  PT Follow up Rec: Home Health Pt (Outpatient Pt If Hhpt Does Not Pan Out)09/06/2023 1406   DVT prophylaxis: enoxaparin  (LOVENOX ) injection 40 mg Start: 08/31/23 1000 Code Status:   Code Status: Full Code Family Communication: plan of care discussed with patient at bedside. Patient status is: Remains hospitalized because of severity of illness Level of care: Telemetry Medical   Dispo: The patient is from: home            Anticipated disposition:home   Objective: Vitals last 24 hrs: Vitals:   09/07/23 9962 09/07/23 9652 09/07/23 9247 09/07/23 9083  BP: 102/73 (!) 96/51 (!) 90/57 113/72  Pulse: 71 64 87 78  Resp: 18 18    Temp: 98.5 F (36.9 C) 97.9 F (36.6 C) 98.1 F (36.7 C)   TempSrc: Oral Oral Oral   SpO2: 93% 94% 95%   Weight:       Height:        Physical Examination: General exam: AAOX3 HEENT:Oral mucosa moist, Ear/Nose WNL grossly Respiratory system: Bilaterally clear BS,no use of accessory muscle Cardiovascular system: S1 & S2 +, No JVD. Gastrointestinal system: Abdomen soft nontender nondistended Nervous System: Alert, awake, moving all extremities,and following commands. Extremities: LE edema neg, distal extremities warm.  Skin: No rashes,no icterus. MSK: Normal muscle bulk,tone, power

## 2023-09-05 NOTE — Plan of Care (Signed)

## 2023-09-05 NOTE — Progress Notes (Signed)
 PROGRESS NOTE [name]  FMW:[id number] DOB: 1965-08-15 DOA: 08/30/2023 PCP: [facility]  Brief Narrative/Hospital Course: [name] is a 58 y.o. year old female with PMH of gastric bypass surgery 2009, currently not being followed for that anymore, not on any  vitamin supplement, history of EtOH abuse with at least 4-6 shots of whiskey each day comes into the hospital with nausea, vomiting and poor p.o. intake. Having some abdominal discomfort and symptoms like this for the past few months, but worsening in the last 2 weeks. Over the last 1 week was unable to eat and drink almost anything. About 3 days ago she could not drink whiskey so she had to stop, and she had some tremors.She came to the ED due to persistent symptoms, was found to be hyponatremic, hypokalemic, CT scan of the abdomen pelvis was unremarkable. She was admitted to the hospital.Labs w/ elevated lipase > x3 and w/ epigastric pain with nausea and vomiting. CT scan>>no e/o acute pancreatitis, however she is very dehydrated and may not be initially apparent, however, met 2 out of 3 criteria for acute pancreatitis.  Hence he has been consulted. EGD showed tortuous esophagus, dilated.  But no other acute finding. Continue regular diet, PPI, antiemetics thiamine  multivitamins Copper  folate. Overall doing well  Subjective: Seen and examined today Having mild headache no vertigo or focal weakness ,tolerating diet. Overnight afebrile BP stable labs with stable electrolytes, normal LFTs CBC with leukopenia and anemia stable  Assessment and plan:   Abdominal pain, nausea, vomiting Suspect acute pancreatitis Dysphagia: Labs w/ elevated lipase > x3 and w/ epigastric pain with nausea and vomiting. CT scan>>no e/o acute pancreatitis, however she is very dehydrated and may not be initially apparent, however, met 2 out of 3 criteria for acute pancreatitis.  Hence he has been consulted. EGD showed tortuous esophagus, dilated.   But no other acute finding. Continue regular diet, PPI, antiemetics thiamine  multivitamins Copper  folate GI following closely hopefully can discharge if tolerates diet but patient is having nausea and abdominal pain after eating lunch today- gi aware.  Elevated LFTs: likely from alcoholic hepatitis.  Improved    Hypovolemic hyponatremia Hypokalemia Hypomagnesemia: Improved    Numbness, tingling Copper  deficiency: going on for the past few days.  B12 was low normal, copper  levels showed deficiency.  Replace   History of gastric bypass surgery: will need at least to go back on a multivitamin upon discharge   Alcohol abuse: Continue CIWA, does not appear to be having significant withdrawals currently   History of hypertension: Currently stable, continue to hold home meds upon discharge, will need to see PCP before resuming  Mobility: PT Orders: Active  PT Follow up Rec: Home Health Pt9/01/2023 [id number]   DVT prophylaxis: enoxaparin  (LOVENOX ) injection 40 mg Start: 08/31/23 1000 Code Status:   Code Status: Full Code Family Communication: plan of care discussed with patient at bedside. Patient status is: Remains hospitalized because of severity of illness Level of care: Telemetry Medical   Dispo: The patient is from: home            Anticipated disposition:home latertoday Objective: Vitals last 24 hrs: Vitals:   09/04/23 2303 09/05/23 0252 09/05/23 0811 09/05/23 1133  BP: 91/68 116/66 115/79 120/75  Pulse: 72 66 70 75  Resp: 14 16 16 18   Temp: 98.6 F (37 C) 98.8 F (37.1 C) 97.6 F (36.4 C) 98.5 F (36.9 C)  TempSrc: Oral Oral Axillary Oral  SpO2: 95% 98% 96% 100%  Weight:      Height:        Physical Examination: General exam: alert awake, oriented, older than stated age HEENT:Oral mucosa moist, Ear/Nose WNL grossly Respiratory system: Bilaterally clear BS,no use of accessory muscle Cardiovascular system: S1 & S2 +, No JVD. Gastrointestinal system: Abdomen  soft,NT,ND, BS+ Nervous System: Alert, awake, moving all extremities,and following commands. Extremities: LE edema neg, distal extremities warm.  Skin: No rashes,no icterus. MSK: Normal muscle bulk,tone, power   Medications reviewed:  Scheduled Meds:  copper   2 mg Oral Daily   vitamin B-12  1,000 mcg Oral Daily   enoxaparin  (LOVENOX ) injection  40 mg Subcutaneous Q24H   folic acid   1 mg Oral Daily   multivitamin with minerals  1 tablet Oral Daily   pantoprazole   40 mg Oral Daily   polyethylene glycol  17 g Oral Daily   thiamine   100 mg Oral Daily   Or   thiamine   100 mg Intravenous Daily   Continuous Infusions:  promethazine  (PHENERGAN ) injection (IM or IVPB) 6.25 mg (09/04/23 1806)   Diet: Diet Order             Diet general           Diet regular Room service appropriate? Yes; Fluid consistency: Thin  Diet effective now                    Data Reviewed: I have personally reviewed following labs and imaging studies ( see epic result tab) CBC: Recent Labs  Lab 08/30/23 1448 08/31/23 0040 08/31/23 0426 09/01/23 0514 09/02/23 1154 09/04/23 0620 09/05/23 0444  WBC 6.9   < > 3.9* 3.1* 2.6* 2.0* 2.7*  NEUTROABS 4.3  --  1.9  --   --   --   --   HGB 16.9*   < > 12.4 11.1* 11.8* 12.1 11.6*  HCT 46.8*   < > 34.3* 32.9* 35.2* 36.8 35.2*  MCV 91.2   < > 91.7 97.6 99.4 101.1* 102.3*  PLT 254   < > 151 142* 126* 160 150   < > = values in this interval not displayed.   CMP: Recent Labs  Lab 09/01/23 0514 09/02/23 0642 09/03/23 0656 09/04/23 0620 09/05/23 0444  NA 134* 136 138 139 138  K 4.0 3.6 4.1 4.1 3.9  CL 98 100 100 101 104  CO2 27 24 29 25 25   GLUCOSE 134* 94 103* 95 203*  BUN 6 <5* <5* <5* <5*  CREATININE 0.67 0.59 0.64 0.62 0.59  CALCIUM 8.1* 8.3* 8.8* 8.7* 8.6*  MG 1.6* 1.6* 1.9 1.7 1.7  PHOS 2.9  --   --   --   --    GFR: Estimated Creatinine Clearance: 59.1 mL/min (by C-G formula based on SCr of 0.59 mg/dL). Recent Labs  Lab 08/31/23 0426  09/01/23 0514 09/03/23 0656 09/04/23 0620 09/05/23 0444  AST 50* 35 43* 39 35  ALT 29 27 36 31 29  ALKPHOS 59 49 50 56 54  BILITOT 0.9 0.3 0.6 0.7 0.4  PROT 4.3* <3.0* 4.9* 4.9* 4.8*  ALBUMIN 2.3* 2.4* 2.6* 2.7* 2.4*    Recent Labs  Lab 08/30/23 1448  LIPASE 180*   No results for input(s): AMMONIA in the last 168 hours. Coagulation Profile: No results for input(s): INR, PROTIME in the last 168 hours. Unresulted Labs (From admission, onward)     Start     Ordered   09/06/23 0500  Creatinine, serum  (enoxaparin  (LOVENOX )  CrCl >/= 30 ml/min)  Weekly,   R     Comments: while on enoxaparin  therapy    08/30/23 2327   08/30/23 2329  Sodium, urine, random  ONCE - URGENT,   URGENT        08/30/23 2328   08/30/23 2329  Osmolality, urine  ONCE - URGENT,   URGENT        08/30/23 2328           Antimicrobials/Microbiology: Anti-infectives (From admission, onward)    Start     Dose/Rate Route Frequency Ordered Stop   09/01/23 1345  azithromycin  (ZITHROMAX ) tablet 500 mg        500 mg Oral Daily 09/01/23 1300 09/03/23 0817   08/31/23 0800  cefTRIAXone  (ROCEPHIN ) 1 g in sodium chloride  0.9 % 100 mL IVPB  Status:  Discontinued        1 g 200 mL/hr over 30 Minutes Intravenous Every 24 hours 08/31/23 0607 09/01/23 1259         Component Value Date/Time   SDES URINE, CLEAN CATCH 05/20/2014 1223   SPECREQUEST Normal 05/20/2014 1223   CULT  05/20/2014 1223    ESCHERICHIA COLI Performed at [facility]    REPTSTATUS 05/22/2014 FINAL 05/20/2014 1223    Procedures: Procedure(s) (LRB): EGD (ESOPHAGOGASTRODUODENOSCOPY) (N/A)   Mennie LAMY, MD [facility] 09/05/2023, 12:59 PM

## 2023-09-05 NOTE — TOC Transition Note (Signed)
 Transition of Care (TOC) - Discharge Note Rayfield Gobble RN,BSN Inpatient Care Management Unit 4NP (Non Trauma)- RN Case Manager See Treatment Team for direct Phone #   Patient Details  Name: [name] MRN: [id number] Date of Birth: 1965-10-24  Transition of Care [facility]) CM/SW Contact:  Gobble Rayfield Hurst, RN Phone Number: 09/05/2023, 12:52 PM   Clinical Narrative:    CM spoke with pt at bedside to discuss therapy recommendations.  Pt confirmed she will need a rollator for home- voiced she does not feel steady on her feet and gets tired easily.  Pt however does not feel she wants HH at this time- voicing she has researched chair exercises and wants to try that on her own first. Pt has PCP and can f/u for [facility] should she change her mind. Pt declined HH services on discharge.   Pt voiced her daughter will come transport home later this afternoon.   Pt voiced she does not have a preference on DME provider. MD stepped into room while this CM present- verbal order given for DME, confirmed pt is stable for transition home today.   Call made to [facility] liaison for DME- rollator to be delivered to pt prior to discharge.   No further IP CM needs noted.    Final next level of care: Home/Self Care Barriers to Discharge: No Barriers Identified   Patient Goals and CMS Choice Patient states their goals for this hospitalization and ongoing recovery are:: return home and get stronger   Choice offered to / list presented to : Patient      Discharge Placement               Home        Discharge Plan and Services Additional resources added to the After Visit Summary for     Discharge Planning Services: CM Consult Post Acute Care Choice: Durable Medical Equipment, Home Health          DME Arranged: Walker rolling with seat DME Agency: [employer] Date DME Agency Contacted: 09/05/23 Time DME Agency Contacted: 1251 Representative spoke with at DME Agency: [facility]  Arranged: PT, Refused HH HH Agency: NA        Social Drivers of Health (SDOH) Interventions SDOH Screenings   Food Insecurity: No Food Insecurity (08/31/2023)  Housing: Low Risk  (08/31/2023)  Transportation Needs: No Transportation Needs (08/31/2023)  Utilities: Not At Risk (08/31/2023)  Financial Resource Strain: Low Risk  (06/05/2023)   Received from [facility]  Social Connections: Unknown (05/14/2021)   Received from [facility]  Tobacco Use: Medium Risk (09/04/2023)     Readmission Risk Interventions    09/05/2023   12:52 PM  Readmission Risk Prevention Plan  Post Dischage Appt Complete  Medication Screening Complete  Transportation Screening Complete

## 2023-09-05 NOTE — Progress Notes (Signed)
 PT Cancellation Note  Patient Details Name: [name] MRN: [id number] DOB: [birth year]   Cancelled Treatment:    Reason Eval/Treat Not Completed: (P) Other (comment). Pt currently nauseated and sweating after advancing diet and eating a small portion of her lunch. RN aware. Pt requesting PT check back later if time permits.   Theo Ferretti, PT, DPT Acute Rehabilitation Services  Office: [phone]    Theo CHRISTELLA Ferretti 09/05/2023, 12:24 PM

## 2023-09-05 NOTE — Progress Notes (Signed)
 Inpatient Progress Note     Patient Profile/Chief Complaint  This is a 58 year old female with a PMH of ETOH abuse, alcoholic pancreatitis, and s/p Roux-en-Y gastric bypass in 2009 originally admitted for ETOH pancreatitis.  Incidentally noted to have progressive dysphagia x 4 months predominantly to solids but not liquids.  EGD 09/04/2023-normal esophageal mucosa, tortuous distal esophagus empirically dilated with a Savary dilator to 15 mm, normal gastric pouch and jejunal limbs of gastric bypass    Interval History   - Reports being stable status post her EGD with dilation yesterday - Reports that swallowing is better and is still taking small bites - Tolerated an omelette last night and this morning; developed abdominal pain and vomiting after eating a tuna wrap today -thinks it was related to the type of food she ate    Objective   Vital signs in last 24 hours: Temp:  [97.6 F (36.4 C)-98.8 F (37.1 C)] 98.5 F (36.9 C) (09/03 1133) Pulse Rate:  [66-92] 75 (09/03 1133) Resp:  [14-18] 18 (09/03 1133) BP: (90-139)/(60-88) 120/75 (09/03 1133) SpO2:  [95 %-100 %] 100 % (09/03 1133) Last BM Date : 09/04/23 General:    Alert, resting in bed no distress Heart:  Regular rate and rhythm; no murmurs Lungs: Respirations even and unlabored, lungs CTA bilaterally Abdomen:  Soft, tender to palpation in the epigastrium, mid abdomen and nondistended. Normal bowel sounds. Extremities:  Without edema. Neurologic:  Alert and oriented,  grossly normal neurologically. Psych:  Cooperative. Normal mood and affect.  Intake/Output from previous day: 09/02 0701 - 09/03 0700 In: 1460 [P.O.:960; I.V.:400; IV Piggyback:100] Out: -  Intake/Output this shift: Total I/O In: 240 [P.O.:240] Out: -   Lab Results: Recent Labs    09/04/23 0620 09/05/23 0444  WBC 2.0* 2.7*  HGB 12.1 11.6*  HCT 36.8 35.2*  PLT 160 150   BMET Recent Labs    09/03/23 0656 09/04/23 0620 09/05/23 0444  NA  138 139 138  K 4.1 4.1 3.9  CL 100 101 104  CO2 29 25 25   GLUCOSE 103* 95 203*  BUN <5* <5* <5*  CREATININE 0.64 0.62 0.59  CALCIUM 8.8* 8.7* 8.6*   LFT Recent Labs    09/05/23 0444  PROT 4.8*  ALBUMIN 2.4*  AST 35  ALT 29  ALKPHOS 54  BILITOT 0.4   PT/INR No results for input(s): LABPROT, INR in the last 72 hours.  Studies/Results: CTAP 08/30/2023 1. 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 2. Hepatic steatosis. 3. Uterine fundus subcentimeter intramural fibroid. 4.  Aortic Atherosclerosis (ICD10-I70.0).   Endoscopic Studies: Normal esophagus.  Biopsies performed with cold forceps for evaluation of eosinophilic esophagitis. - Tortuous esophagus. Esophageal tortuosity could be contributing to symptoms of dysphagia. - No endoscopic esophageal abnormality to explain patient's dysphagia with the exception of esophageal tortuosity.. Dilated. - Gastric bypass with a normal-sized pouch. Gastrojejunal anastomosis characterized by healthy appearing mucosa. - Normal examined jejunal limbs.   Clinical Impression   This is a 58 year old female with a PMH of ETOH abuse, alcoholic pancreatitis, and s/p Roux-en-Y gastric bypass in 2009 originally admitted for ETOH pancreatitis.  Incidentally noted to have progressive dysphagia x 4 months predominantly to solids but not liquids.  EGD 09/04/2023-normal esophageal mucosa, tortuous distal esophagus empirically dilated with a Savary dilator to 15 mm, normal gastric pouch and jejunal limbs of gastric bypass  Patient reports being clinically stable status  post EGD with dilation yesterday.  Tolerated consumption of an omelette yesterday.  Developed abdominal pain and vomiting today after eating a tuna wrap.  She thinks it was related to the type of food she ate.  Reports that dysphagia is improved since dilation.   Plan  Continue symptomatic management of  abdominal pain and vomiting with antiemetics and pain control Continue pantoprazole  40 mg orally daily Follow-up biopsies from EGD Advise consuming diet primarily of soft foods or those that are easy to chew.  Patient counseled to chew food well. Continue MiraLAX  17 g p.o. daily    LOS: 6 days   BETINA PUCKETT  09/05/2023, 2:07 PM  [name], MD [facility]

## 2023-09-06 ENCOUNTER — Other Ambulatory Visit (HOSPITAL_COMMUNITY): Payer: Self-pay

## 2023-09-06 ENCOUNTER — Encounter (HOSPITAL_COMMUNITY): Payer: Self-pay | Admitting: Pediatrics

## 2023-09-06 ENCOUNTER — Ambulatory Visit: Payer: Self-pay | Admitting: Pediatrics

## 2023-09-06 DIAGNOSIS — R109 Unspecified abdominal pain: Secondary | ICD-10-CM | POA: Diagnosis not present

## 2023-09-06 DIAGNOSIS — R1013 Epigastric pain: Secondary | ICD-10-CM

## 2023-09-06 DIAGNOSIS — K852 Alcohol induced acute pancreatitis without necrosis or infection: Principal | ICD-10-CM

## 2023-09-06 LAB — CREATININE, SERUM
Creatinine, Ser: 0.55 mg/dL (ref 0.44–1.00)
GFR, Estimated: >60 mL/min (ref >60)

## 2023-09-06 MED ORDER — MELATONIN 3 MG PO TABS
3.0000 mg | ORAL_TABLET | Freq: Every day | ORAL | Status: DC
  Administered 2023-09-06: 3 mg via ORAL
  Filled 2023-09-06: qty 1

## 2023-09-06 MED ORDER — SODIUM CHLORIDE 0.9 % IV SOLN: INTRAVENOUS | Status: DC

## 2023-09-06 MED ORDER — ENSURE PLUS HIGH PROTEIN PO LIQD
237.0000 mL | Freq: Two times a day (BID) | ORAL | Status: DC
  Administered 2023-09-06 – 2023-09-07 (×2): 237 mL via ORAL

## 2023-09-06 MED ORDER — SODIUM CHLORIDE 0.9 % IV BOLUS
500.0000 mL | Freq: Once | INTRAVENOUS | Status: AC
  Administered 2023-09-06: 500 mL via INTRAVENOUS

## 2023-09-06 MED ORDER — ENSURE PLUS HIGH PROTEIN PO LIQD: 237.0000 mL | Freq: Two times a day (BID) | ORAL | 0 refills | Status: AC

## 2023-09-06 NOTE — Progress Notes (Signed)
 Inpatient Progress Note     Patient Profile/Chief Complaint  58 year old female with a PMH of ETOH abuse, alcoholic pancreatitis, and s/p Roux-en-Y gastric bypass in 2009 originally admitted for ETOH pancreatitis.  Incidentally noted to have progressive dysphagia x 4 months predominantly to solids but not liquids.  EGD 09/04/2023-normal esophageal mucosa, tortuous distal esophagus empirically dilated with a Savary dilator to 15 mm, normal gastric pouch and jejunal limbs of gastric bypass  Patient reported feeling well immediately after her endoscopy on 09/04/2023. On 09/05/2023 she developed an episode of severe acute abdominal pain and vomiting after consuming a tuna wrap.  Query if she may be experiencing recurrent symptoms of pancreatitis.    Interval History   - Abdominal pain  is improved today compared to yesterday - Notes feeling somewhat clammy and sweaty today but not currently having pain - States she will be trialing some clear liquids and possibly fruit this afternoon - Dysphagia has resolved since EGD with dilation - States she finally had a bowel movement after taking MiraLAX     Objective   Vital signs in last 24 hours: Temp:  [98.5 F (36.9 C)-99.1 F (37.3 C)] 98.6 F (37 C) (09/04 0753) Pulse Rate:  [75-95] 91 (09/04 0753) Resp:  [14-18] 14 (09/04 0255) BP: (104-141)/(61-88) 120/82 (09/04 0753) SpO2:  [91 %-98 %] 97 % (09/04 0255) Last BM Date : 09/04/23 General:    Alert, resting in bed no distress Heart:  Regular rate and rhythm; no murmurs Lungs: Respirations even and unlabored, lungs CTA bilaterally Abdomen:  Soft, tender to palpation in the epigastrium, mid abdomen and nondistended. Normal bowel sounds. Extremities:  Without edema. Neurologic:  Alert and oriented,  grossly normal neurologically. Psych:  Cooperative. Normal mood and affect.  Intake/Output from previous day: 09/03 0701 - 09/04 0700 In: 480 [P.O.:480] Out: -  Intake/Output this  shift: No intake/output data recorded.  Lab Results: Recent Labs    09/04/23 0620 09/05/23 0444  WBC 2.0* 2.7*  HGB 12.1 11.6*  HCT 36.8 35.2*  PLT 160 150   BMET Recent Labs    09/04/23 0620 09/05/23 0444 09/06/23 0520  NA 139 138  --   K 4.1 3.9  --   CL 101 104  --   CO2 25 25  --   GLUCOSE 95 203*  --   BUN <5* <5*  --   CREATININE 0.62 0.59 0.55  CALCIUM 8.7* 8.6*  --    LFT Recent Labs    09/05/23 0444  PROT 4.8*  ALBUMIN 2.4*  AST 35  ALT 29  ALKPHOS 54  BILITOT 0.4   PT/INR No results for input(s): LABPROT, INR in the last 72 hours.  Studies/Results: CTAP 08/30/2023 1. 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 2. Hepatic steatosis. 3. Uterine fundus subcentimeter intramural fibroid. 4.  Aortic Atherosclerosis (ICD10-I70.0).   Endoscopic Studies: Normal esophagus.  Biopsies performed with cold forceps for evaluation of eosinophilic esophagitis. - Tortuous esophagus. Esophageal tortuosity could be contributing to symptoms of dysphagia. - No endoscopic esophageal abnormality to explain patient's dysphagia with the exception of esophageal tortuosity.. Dilated. - Gastric bypass with a normal-sized pouch. Gastrojejunal anastomosis characterized by healthy appearing mucosa. - Normal examined jejunal limbs.  EGD biopsies negative for EOE   Clinical Impression   This is a 58 year old female with a PMH of ETOH abuse, alcoholic pancreatitis, and s/p Roux-en-Y gastric bypass in 2009 originally admitted  for ETOH pancreatitis.  Incidentally noted to have progressive dysphagia x 4 months predominantly to solids but not liquids.  EGD 09/04/2023-normal esophageal mucosa, tortuous distal esophagus empirically dilated with a Savary dilator to 15 mm, normal gastric pouch and jejunal limbs of gastric bypass  Patient reports being clinically stable status post EGD with dilation  09/04/2023. On 09/05/2023 she developed an episode of severe acute abdominal pain and vomiting after consuming a tuna wrap.  Query if she may be experiencing recurrent symptoms of pancreatitis.  Pain today is improved but has not resolved.  Will be trialing p.o. again this afternoon.  Has also had symptoms of constipation and had a bowel movement today which may also be contributing to discomfort.     Plan  Continue symptomatic management of abdominal pain and vomiting with antiemetics and pain control Continue pantoprazole  40 mg orally daily Advise consuming diet primarily of soft foods or those that are easy to chew.  Patient counseled to chew food well. Continue MiraLAX  17 g p.o. daily -can titrate depending upon bowel movements If patient continues to experience recurrent severe abdominal pain consider updated lipase and abdominal imaging to ensure there is not recurrence or complication of pancreatitis.    LOS: 7 days   ENYLA LISBON  09/06/2023, 12:47 PM  [name], MD [facility]

## 2023-09-06 NOTE — Progress Notes (Signed)
 PROGRESS NOTE [name]  FMW:[id number] DOB: 25-Oct-1965 DOA: 08/30/2023 PCP: [facility]  Brief Narrative/Hospital Course: [name] is a 58 y.o. year old female with PMH of gastric bypass surgery 2009, currently not being followed for that anymore, not on any  vitamin supplement, history of EtOH abuse with at least 4-6 shots of whiskey each day comes into the hospital with nausea, vomiting and poor p.o. intake. Having some abdominal discomfort and symptoms like this for the past few months, but worsening in the last 2 weeks. Over the last 1 week was unable to eat and drink almost anything. About 3 days ago she could not drink whiskey so she had to stop, and she had some tremors.She came to the ED due to persistent symptoms, was found to be hyponatremic, hypokalemic, CT scan of the abdomen pelvis was unremarkable. She was admitted to the hospital.Labs w/ elevated lipase > x3 and w/ epigastric pain with nausea and vomiting. CT scan>>no e/o acute pancreatitis, however she is very dehydrated and may not be initially apparent, however, met 2 out of 3 criteria for acute pancreatitis.  Hence he has been consulted. EGD showed tortuous esophagus, dilated.  But no other acute finding. Continue regular diet, PPI, antiemetics thiamine  multivitamins Copper  folate. Overall doing well,, plan for discharge once tolerating diet  Subjective: Seen and examined today Overnight no fever Labs ok. She would like to wait one more day for d/c  Assessment and plan:   Abdominal pain, nausea, vomiting Suspected Etoh acute pancreatitis Progressive dysphagiax 4 months: Labs w/ elevated lipase > x3 and w/ epigastric pain with nausea and vomiting. CT scan>>no e/o acute pancreatitis, however she is very dehydrated and may not be initially apparent, however, met 2 out of 3 criteria for acute pancreatitis.  S/p EGD showed tortuous esophagus, dilated-dysphagia has since improved Continue management manage  management PPI pain management antiemetics, thiamine  multivitamins Copper  folate GI following closely.  Some abdominal discomfort after eating tuna wrap 9/3 Plan for discharge once tolerating diet.  Discussed with GI team  Elevated LFTs: likely from alcoholic hepatitis.  Improved    Hypovolemic hyponatremia Hypokalemia Hypomagnesemia: Improved    Numbness, tingling Copper  deficiency: going on for the past few days.  B12 was low normal, copper  levels showed deficiency.  Replace   History of gastric bypass surgery: will need at least to go back on a multivitamin upon discharge   Alcohol abuse: Continue CIWA, does not appear to be having significant withdrawals currently   History of hypertension: Currently stable, continue to hold home meds upon discharge, will need to see PCP before resuming  Mobility: PT Orders: Active  PT Follow up Rec: Home Health Pt9/01/2023 [id number]   DVT prophylaxis: enoxaparin  (LOVENOX ) injection 40 mg Start: 08/31/23 1000 Code Status:   Code Status: Full Code Family Communication: plan of care discussed with patient at bedside. Patient status is: Remains hospitalized because of severity of illness Level of care: Telemetry Medical   Dispo: The patient is from: home            Anticipated disposition:home soon once tolerating diet Objective: Vitals last 24 hrs: Vitals:   09/05/23 1942 09/05/23 2252 09/06/23 0255 09/06/23 0753  BP: (!) 141/86 132/61 139/88 120/82  Pulse: 76 95 90 91  Resp: 18 16 14    Temp: 98.9 F (37.2 C) 98.8 F (37.1 C) 98.5 F (36.9 C) 98.6 F (37 C)  TempSrc: Oral Oral Oral Oral  SpO2: 98% 91% 97%  Weight:      Height:        Physical Examination: General exam: alert awake, oriented HEENT:Oral mucosa moist, Ear/Nose WNL grossly Respiratory system: Bilaterally clear BS,no use of accessory muscle Cardiovascular system: S1 & S2 +, No JVD. Gastrointestinal system: Abdomen soft NTM,ND, BS+ Nervous System: Alert, awake,  moving all extremities,and following commands. Extremities: LE edema neg, distal extremities warm.  Skin: No rashes,no icterus. MSK: Normal muscle bulk,tone, power   Medications reviewed:  Scheduled Meds:  copper   2 mg Oral Daily   vitamin B-12  1,000 mcg Oral Daily   enoxaparin  (LOVENOX ) injection  40 mg Subcutaneous Q24H   folic acid   1 mg Oral Daily   multivitamin with minerals  1 tablet Oral Daily   pantoprazole   40 mg Oral Daily   polyethylene glycol  17 g Oral Daily   thiamine   100 mg Oral Daily   Or   thiamine   100 mg Intravenous Daily   Continuous Infusions:  promethazine  (PHENERGAN ) injection (IM or IVPB) 6.25 mg (09/04/23 1806)   Diet: Diet Order             Diet general           Diet regular Room service appropriate? Yes; Fluid consistency: Thin  Diet effective now                    Data Reviewed: I have personally reviewed following labs and imaging studies ( see epic result tab) CBC: Recent Labs  Lab 08/30/23 1448 08/31/23 0040 08/31/23 0426 09/01/23 0514 09/02/23 1154 09/04/23 0620 09/05/23 0444  WBC 6.9   < > 3.9* 3.1* 2.6* 2.0* 2.7*  NEUTROABS 4.3  --  1.9  --   --   --   --   HGB 16.9*   < > 12.4 11.1* 11.8* 12.1 11.6*  HCT 46.8*   < > 34.3* 32.9* 35.2* 36.8 35.2*  MCV 91.2   < > 91.7 97.6 99.4 101.1* 102.3*  PLT 254   < > 151 142* 126* 160 150   < > = values in this interval not displayed.   CMP: Recent Labs  Lab 09/01/23 0514 09/02/23 0642 09/03/23 0656 09/04/23 0620 09/05/23 0444 09/06/23 0520  NA 134* 136 138 139 138  --   K 4.0 3.6 4.1 4.1 3.9  --   CL 98 100 100 101 104  --   CO2 27 24 29 25 25   --   GLUCOSE 134* 94 103* 95 203*  --   BUN 6 <5* <5* <5* <5*  --   CREATININE 0.67 0.59 0.64 0.62 0.59 0.55  CALCIUM 8.1* 8.3* 8.8* 8.7* 8.6*  --   MG 1.6* 1.6* 1.9 1.7 1.7  --   PHOS 2.9  --   --   --   --   --    GFR: Estimated Creatinine Clearance: 59.1 mL/min (by C-G formula based on SCr of 0.55 mg/dL). Recent Labs  Lab  08/31/23 0426 09/01/23 0514 09/03/23 0656 09/04/23 0620 09/05/23 0444  AST 50* 35 43* 39 35  ALT 29 27 36 31 29  ALKPHOS 59 49 50 56 54  BILITOT 0.9 0.3 0.6 0.7 0.4  PROT 4.3* <3.0* 4.9* 4.9* 4.8*  ALBUMIN 2.3* 2.4* 2.6* 2.7* 2.4*    Recent Labs  Lab 08/30/23 1448  LIPASE 180*   No results for input(s): AMMONIA in the last 168 hours. Coagulation Profile: No results for input(s): INR, PROTIME in the last  168 hours. Unresulted Labs (From admission, onward)     Start     Ordered   09/06/23 0500  Creatinine, serum  (enoxaparin  (LOVENOX )    CrCl >/= 30 ml/min)  Weekly,   R     Comments: while on enoxaparin  therapy    08/30/23 2327           Antimicrobials/Microbiology: Anti-infectives (From admission, onward)    Start     Dose/Rate Route Frequency Ordered Stop   09/01/23 1345  azithromycin  (ZITHROMAX ) tablet 500 mg        500 mg Oral Daily 09/01/23 1300 09/03/23 0817   08/31/23 0800  cefTRIAXone  (ROCEPHIN ) 1 g in sodium chloride  0.9 % 100 mL IVPB  Status:  Discontinued        1 g 200 mL/hr over 30 Minutes Intravenous Every 24 hours 08/31/23 0607 09/01/23 1259         Component Value Date/Time   SDES URINE, CLEAN CATCH 05/20/2014 1223   SPECREQUEST Normal 05/20/2014 1223   CULT  05/20/2014 1223    ESCHERICHIA COLI Performed at [facility]    REPTSTATUS 05/22/2014 FINAL 05/20/2014 1223    Procedures: Procedure(s) (LRB): EGD (ESOPHAGOGASTRODUODENOSCOPY) (N/A)   Mennie LAMY, MD [facility] 09/06/2023, 2:25 PM

## 2023-09-06 NOTE — Progress Notes (Signed)
 Physical Therapy Treatment Patient Details Name: [name] MRN: [id number] DOB: 09-18-1965 Today's Date: 09/06/2023   History of Present Illness 58 yo female arrives to [facility] ED on 08/30/23 with c/o SOB, nausea, vomiting, and paresthesias. CT abdomen pelvis and chest x-ray show nothing acute. Suspect for acute pancreatitis but pt was dehydrated. PMH: EtOH abuse, gastric bypass (2009),    PT Comments  Pt is progressing mobility and functional endurance during today's session. Pt ambulated short community distance with a short standing break using a rollator in the hallways. Pt experienced no significant LOB when scanning the environment and distracted walking with the rollator. PT reinforced education on using the brakes on the rollator and how to set them when needing to take a seated rest break. Pt to benefit from further PT to increase functional endurance and mobility. PT to follow acutely.    If plan is discharge home, recommend the following: A little help with walking and/or transfers;Assistance with cooking/housework;Assist for transportation;A little help with bathing/dressing/bathroom   Can travel by private vehicle        Equipment Recommendations  Rollator (4 wheels)    Recommendations for Other Services       Precautions / Restrictions Precautions Precautions: Fall Recall of Precautions/Restrictions: Intact Restrictions Weight Bearing Restrictions Per Provider Order: No     Mobility  Bed Mobility Overal bed mobility: Needs Assistance Bed Mobility: Sit to Supine       Sit to supine: Supervision        Transfers Overall transfer level: Needs assistance Equipment used: Rollator (4 wheels) Transfers: Sit to/from Stand Sit to Stand: Supervision           General transfer comment: For safety    Ambulation/Gait Ambulation/Gait assistance: Supervision Gait Distance (Feet): 350 Feet (Standing rest break halfway)   Gait Pattern/deviations: Step-through  pattern, Decreased stride length Gait velocity: Decreased     General Gait Details: No overt LOB when scanning the environment   Stairs             Wheelchair Mobility     Tilt Bed    Modified Rankin (Stroke Patients Only)       Balance Overall balance assessment: Needs assistance Sitting-balance support: Bilateral upper extremity supported, Feet supported Sitting balance-Leahy Scale: Good     Standing balance support: Bilateral upper extremity supported, During functional activity, Reliant on assistive device for balance Standing balance-Leahy Scale: Fair Standing balance comment: Grabs onto surfaces when not using AD. Uses rollator for ambulation                            Communication Communication Communication: No apparent difficulties  Cognition Arousal: Alert Behavior During Therapy: WFL for tasks assessed/performed   PT - Cognitive impairments: No apparent impairments                         Following commands: Intact      Cueing Cueing Techniques: Verbal cues, Gestural cues  Exercises      General Comments General comments (skin integrity, edema, etc.): VSS on RA      Pertinent Vitals/Pain Pain Assessment Pain Assessment: Faces Faces Pain Scale: Hurts a little bit Pain Location: Abdomen with increased activity Pain Descriptors / Indicators: Aching, Grimacing Pain Intervention(s): Monitored during session, Limited activity within patient's tolerance    Home Living  Prior Function            PT Goals (current goals can now be found in the care plan section) Acute Rehab PT Goals Patient Stated Goal: Return to independent PT Goal Formulation: With patient Time For Goal Achievement: 09/17/23 Potential to Achieve Goals: Good Progress towards PT goals: Progressing toward goals    Frequency    Min 2X/week      PT Plan      Co-evaluation              AM-PAC PT 6  Clicks Mobility   Outcome Measure  Help needed turning from your back to your side while in a flat bed without using bedrails?: A Little Help needed moving from lying on your back to sitting on the side of a flat bed without using bedrails?: A Little Help needed moving to and from a bed to a chair (including a wheelchair)?: A Little Help needed standing up from a chair using your arms (e.g., wheelchair or bedside chair)?: A Little Help needed to walk in hospital room?: A Little Help needed climbing 3-5 steps with a railing? : A Little 6 Click Score: 18    End of Session   Activity Tolerance: Patient tolerated treatment well Patient left: in bed;with call bell/phone within reach Nurse Communication: Mobility status PT Visit Diagnosis: Unsteadiness on feet (R26.81);Muscle weakness (generalized) (M62.81);History of falling (Z91.81)     Time: 1346-1401 PT Time Calculation (min) (ACUTE ONLY): 15 min  Charges:    $Therapeutic Activity: 8-22 mins PT General Charges $$ ACUTE PT VISIT: 1 Visit                     Quintin Campi, SPT  Acute Rehab  [phone]    Quintin Campi 09/06/2023, 2:15 PM

## 2023-09-06 NOTE — Progress Notes (Signed)
 Initial Nutrition Assessment  DOCUMENTATION CODES:   Non-severe (moderate) malnutrition in context of chronic illness  INTERVENTION:  Encourage PO intake - Currently on regular diet Recommend following a low fat diet to help with pancreatitis  Ensure Plus High Protein po BID, each supplement provides 350 kcal and 20 grams of protein MVI with minerals daily 100 mg Thiamine  daily  Continue copper  and folic acid  supplementation  Provided education regarding pancreatitis and gave Pancreatitis Nutrition Therapy handout Recommend updated weight  Recommend starting nutrition support if pt is not tolerating diet as pt is already malnourished and has gone >7 days without adequate PO intake   NUTRITION DIAGNOSIS:   Moderate Malnutrition related to chronic illness as evidenced by mild muscle depletion, mild fat depletion, energy intake < or equal to 75% for > or equal to 1 month.  GOAL:   Patient will meet greater than or equal to 90% of their needs  MONITOR:   PO intake, Supplement acceptance, Labs  REASON FOR ASSESSMENT:   Consult Assessment of nutrition requirement/status, Diet education  ASSESSMENT:  58 y.o. female with history of alcoholism and prior gastric bypass surgery in 2009 has been experiencing abdominal pain for the last 4 to 5 months.  Symptoms got worse after February of this year probably from a viral gastroenteritis. Admitted for alcoholic pancreatitis and dysphagia.   8/28 - CT abd/pelvis showed no evidence of acute pancreatitis 9/2 - EGD with dilation, regular diet   Pt with progressive dysphagia and abdominal pain for 4-5 months and has not been able to eat much besides liquids and soft foods. Her pain has progressively worsened over the last 2 weeks and within the last 8 days PTA pt reports not being able to keep anything down including water. At baseline pt eats 2-3 small meals per day, does not take multivitamin or protein shakes. Has been drinking 4-8 shots of  whiskey every night to help her sleep.   Pt has not been eating much since admission. Meals documented reflect pt is eating 100% of meals however pt only ordering soup and broth. Has not had any ONS since admission. Pt had EGD with dilation yesterday which pt states has helped with swallowing. Had a vegetable omelet yesterday with soup and juice for breakfast which she tolerated. Unfortunately had a tuna salad wrap yesterday for lunch and immediately vomited afterwards. Did not have anything for dinner. Only had broth for breakfast this morning and ordered a small lunch of fruit, soup, and avocado today. CT scan showed no evidence of acute pancreatitis however pt showing signs of acute pancreatitis. Provided Pancreatitis nutrition education and answered any questions pt had. Pt agreeable to starting Ensures.   Pt reports before admission she went from 126 lbs in June of this year to 106 lbs however no documentation reflecting pt weighing 106 lbs. Requested updated weight from RN. If this is accurate pt has lost 20 lbs,16% in 3 months. Which is clinically significant. Exam shows mild fat and muscle depletions. Pt qualifies for moderate malnutrition however pt at high nutritional risk and worsen if continued poor PO intake. Encouraged use of ONS and following a low fat diet. If pt does not tolerate diet recommend starting nutrition support.  Admit weight: 57.2 kg Current weight: 57.2 kg   Average Meal Intake: 8/29-9/3: 100% intake x 5 recorded meals  Nutritionally Relevant Medications: Scheduled Meds:  copper   2 mg Oral Daily   vitamin B-12  1,000 mcg Oral Daily   enoxaparin  (LOVENOX ) injection  40 mg Subcutaneous Q24H   feeding supplement  237 mL Oral BID BM   folic acid   1 mg Oral Daily   multivitamin with minerals  1 tablet Oral Daily   pantoprazole   40 mg Oral Daily   polyethylene glycol  17 g Oral Daily   thiamine   100 mg Oral Daily   Or   thiamine   100 mg Intravenous Daily   Continuous  Infusions:  promethazine  (PHENERGAN ) injection (IM or IVPB) 6.25 mg (09/04/23 1806)   Labs Reviewed: BUN <5 Calcium 8.6 Copper  44 CBG ranges from 95-203 mg/dL over the last 24 hours No recent A1C  NUTRITION - FOCUSED PHYSICAL EXAM:  Flowsheet Row Most Recent Value  Orbital Region No depletion  Upper Arm Region No depletion  Thoracic and Lumbar Region No depletion  Buccal Region No depletion  Temple Region Mild depletion  Clavicle Bone Region Mild depletion  Clavicle and Acromion Bone Region Mild depletion  Scapular Bone Region No depletion  Dorsal Hand No depletion  Patellar Region Mild depletion  Anterior Thigh Region Mild depletion  Posterior Calf Region Mild depletion  Edema (RD Assessment) None  Hair Reviewed  Eyes Reviewed  Mouth Reviewed  Skin Reviewed  Nails Reviewed    Diet Order:   Diet Order             Diet general           Diet regular Room service appropriate? Yes; Fluid consistency: Thin  Diet effective now                   EDUCATION NEEDS:   Education needs have been addressed  Skin:  Skin Assessment: Reviewed RN Assessment  Last BM:  9/4, type 2  Height:   Ht Readings from Last 1 Encounters:  08/30/23 5' 2 (1.575 m)    Weight:   Wt Readings from Last 1 Encounters:  08/30/23 57.2 kg    Ideal Body Weight:  50 kg  BMI:  Body mass index is 23.05 kg/m.  Estimated Nutritional Needs:   Kcal:  1600-1800 kcal  Protein:  80-100 gm  Fluid:  >1.6L/day   Olivia Kenning, RD Registered Dietitian  See Amion for more information

## 2023-09-07 ENCOUNTER — Other Ambulatory Visit (HOSPITAL_COMMUNITY): Payer: Self-pay

## 2023-09-07 DIAGNOSIS — R109 Unspecified abdominal pain: Secondary | ICD-10-CM | POA: Diagnosis not present

## 2023-09-07 DIAGNOSIS — E440 Moderate protein-calorie malnutrition: Secondary | ICD-10-CM | POA: Insufficient documentation

## 2023-09-07 LAB — CBC
HCT: 36.1 % (ref 36.0–46.0)
Hemoglobin: 11.7 g/dL — ABNORMAL LOW (ref 12.0–15.0)
MCH: 33 pg (ref 26.0–34.0)
MCHC: 32.4 g/dL (ref 30.0–36.0)
MCV: 101.7 fL — ABNORMAL HIGH (ref 80.0–100.0)
Platelets: 210 K/uL (ref 150–400)
RBC: 3.55 MIL/uL — ABNORMAL LOW (ref 3.87–5.11)
RDW: 13.1 % (ref 11.5–15.5)
WBC: 2.5 K/uL — ABNORMAL LOW (ref 4.0–10.5)
nRBC: 0 % (ref 0.0–0.2)

## 2023-09-07 LAB — COMPREHENSIVE METABOLIC PANEL WITH GFR
ALT: 26 U/L (ref 0–44)
AST: 36 U/L (ref 15–41)
Albumin: 2.6 g/dL — ABNORMAL LOW (ref 3.5–5.0)
Alkaline Phosphatase: 51 U/L (ref 38–126)
Anion gap: 9 (ref 5–15)
BUN: <5 mg/dL — ABNORMAL LOW (ref 6–20)
CO2: 25 mmol/L (ref 22–32)
Calcium: 8.7 mg/dL — ABNORMAL LOW (ref 8.9–10.3)
Chloride: 106 mmol/L (ref 98–111)
Creatinine, Ser: 0.52 mg/dL (ref 0.44–1.00)
GFR, Estimated: >60 mL/min (ref >60)
Glucose, Bld: 117 mg/dL — ABNORMAL HIGH (ref 70–99)
Potassium: 4.2 mmol/L (ref 3.5–5.1)
Sodium: 140 mmol/L (ref 135–145)
Total Bilirubin: 0.5 mg/dL (ref 0.0–1.2)
Total Protein: 5.4 g/dL — ABNORMAL LOW (ref 6.5–8.1)

## 2023-09-07 MED ORDER — MIDODRINE HCL 5 MG PO TABS
5.0000 mg | ORAL_TABLET | Freq: Three times a day (TID) | ORAL | Status: DC
  Administered 2023-09-07: 5 mg via ORAL
  Filled 2023-09-07: qty 1

## 2023-09-07 MED ORDER — MIDODRINE HCL 5 MG PO TABS
5.0000 mg | ORAL_TABLET | Freq: Three times a day (TID) | ORAL | 0 refills | Status: AC | PRN
  Filled 2023-09-07: qty 30, 10d supply, fill #0

## 2023-09-07 NOTE — Discharge Summary (Signed)
 Physician Discharge Summary  [name] FMW:[id number] DOB: 1965/05/05 DOA: 08/30/2023  PCP: [facility]  Admit date: 08/30/2023 Discharge date: 09/07/2023 Recommendations for Outpatient Follow-up:  Follow up with PCP in 1 weeks-call for appointment Please obtain BMP/CBC in one week  Discharge Dispo: home Discharge Condition: Stable Code Status:   Code Status: Full Code Diet recommendation:  Diet Order             Diet general           Diet regular Room service appropriate? Yes; Fluid consistency: Thin  Diet effective now                    Brief/Interim Summary: [name] is a 58 y.o. year old female with PMH of gastric bypass surgery 2009, currently not being followed for that anymore, not on any  vitamin supplement, history of EtOH abuse with at least 4-6 shots of whiskey each day comes into the hospital with nausea, vomiting and poor p.o. intake. Having some abdominal discomfort and symptoms like this for the past few months, but worsening in the last 2 weeks. Over the last 1 week was unable to eat and drink almost anything. About 3 days ago she could not drink whiskey so she had to stop, and she had some tremors.She came to the ED due to persistent symptoms, was found to be hyponatremic, hypokalemic, CT scan of the abdomen pelvis was unremarkable. She was admitted to the hospital.Labs w/ elevated lipase > x3 and w/ epigastric pain with nausea and vomiting. CT scan>>no e/o acute pancreatitis, however she is very dehydrated and may not be initially apparent, however, met 2 out of 3 criteria for acute pancreatitis.  Hence he has been consulted. EGD showed tortuous esophagus, dilated.  But no other acute finding. Continue regular diet, PPI, antiemetics thiamine  multivitamins Copper  folate. Overall doing well,, plan for discharge once tolerating diet She feels ready for discharge home today just had a bowel movement  Subjective: Seen and examined today Overnight  no fever Labs ok. She wants to go home this morning no dizziness lightheadedness although BP soft at times but currently 113/72  Discharge diagnosis:   Abdominal pain, nausea, vomiting Acute pancreatitis-alcohol  Progressive dysphagiax 4 months: Labs w/ elevated lipase > x3 and w/ epigastric pain with nausea and vomiting. CT scan>>no e/o acute pancreatitis, however she is very dehydrated and may not be initially apparent, however, met 2 out of 3 criteria for acute pancreatitis.  S/p EGD showed tortuous esophagus, dilated-dysphagia has since improved. Continue PPI antiemetics multivitamins thiamine , foalte and Copper  GI following closely-not tolerating diet had a bowel movement, she is eager to go home today   Elevated LFTs: likely from alcoholic hepatitis.  Improved    Hypovolemic hyponatremia Hypokalemia Hypomagnesemia: Improved    Numbness, tingling Copper  deficiency: going on for the past few days.  B12 was low normal, copper  levels showed deficiency.  Replacing   History of gastric bypass surgery: will need at least to go back on a multivitamin upon discharge   Alcohol abuse: Continue CIWA, does not appear to be having significant withdrawals currently   Soft blood pressure/hypertension History of hypertension: Currently stable, continue to hold home meds upon discharge, will need to see PCP before resuming.  Continue midodrine  as needed, orthostatic vitals negative, and asymptomatic.  Moderate malnutrition: Recommend diet protein supplement Nutrition Problem: Moderate Malnutrition Etiology: chronic illness Signs/Symptoms: mild muscle depletion, mild fat depletion, energy intake < or equal  to 75% for > or equal to 1 month Interventions: Refer to RD note for recommendations   Mobility: PT Orders: Active  PT Follow up Rec: Home Health Pt (Outpatient Pt If Hhpt Does Not Pan Out)09/06/2023 1406   DVT prophylaxis: enoxaparin  (LOVENOX ) injection 40 mg Start: 08/31/23  1000 Code Status:   Code Status: Full Code Family Communication: plan of care discussed with patient at bedside. Patient status is: Remains hospitalized because of severity of illness Level of care: Telemetry Medical   Dispo: The patient is from: home            Anticipated disposition:home   Objective: Vitals last 24 hrs: Vitals:   09/07/23 0037 09/07/23 0347 09/07/23 0752 09/07/23 0916  BP: 102/73 (!) 96/51 (!) 90/57 113/72  Pulse: 71 64 87 78  Resp: 18 18    Temp: 98.5 F (36.9 C) 97.9 F (36.6 C) 98.1 F (36.7 C)   TempSrc: Oral Oral Oral   SpO2: 93% 94% 95%   Weight:      Height:        Physical Examination: General exam: AAOX3 HEENT:Oral mucosa moist, Ear/Nose WNL grossly Respiratory system: Bilaterally clear BS,no use of accessory muscle Cardiovascular system: S1 & S2 +, No JVD. Gastrointestinal system: Abdomen soft nontender nondistended Nervous System: Alert, awake, moving all extremities,and following commands. Extremities: LE edema neg, distal extremities warm.  Skin: No rashes,no icterus. MSK: Normal muscle bulk,tone, power    Procedure(s) (LRB): EGD (ESOPHAGOGASTRODUODENOSCOPY) (N/A)  Consultation: See note.  Discharge Instructions  Discharge Instructions     Diet general   Complete by: As directed    Discharge instructions   Complete by: As directed    Please call call MD or return to ER for similar or worsening recurring problem that brought you to hospital or if any fever,nausea/vomiting,abdominal pain, uncontrolled pain, chest pain,  shortness of breath or any other alarming symptoms.  Please follow-up your doctor as instructed in a week time and call the office for appointment.  Please avoid alcohol, smoking, or any other illicit substance and maintain healthy habits including taking your regular medications as prescribed.  You were cared for by a hospitalist during your hospital stay. If you have any questions about your discharge medications  or the care you received while you were in the hospital after you are discharged, you can call the unit and ask to speak with the hospitalist on call if the hospitalist that took care of you is not available.  Once you are discharged, your primary care physician will handle any further medical issues. Please note that NO REFILLS for any discharge medications will be authorized once you are discharged, as it is imperative that you return to your primary care physician (or establish a relationship with a primary care physician if you do not have one) for your aftercare needs so that they can reassess your need for medications and monitor your lab values   Increase activity slowly   Complete by: As directed       Allergies as of 09/07/2023       Reactions   Xylocaine [lidocaine Hcl]    seizure   Morphine          Medication List     STOP taking these medications    amLODipine 5 MG tablet Commonly known as: NORVASC   triamterene-hydrochlorothiazide 37.5-25 MG tablet Commonly known as: MAXZIDE-25       TAKE these medications    aspirin -acetaminophen -caffeine  250-250-65 MG tablet Commonly known  as: EXCEDRIN  MIGRAINE Take 1 tablet by mouth every 8 (eight) hours as needed for headache.   copper  tablet Take 1 tablet (2 mg total) by mouth daily.   cyanocobalamin  1000 MCG tablet Commonly known as: VITAMIN B12 Take 1 tablet (1,000 mcg total) by mouth daily.   feeding supplement Liqd Take 237 mLs by mouth 2 (two) times daily between meals for 7 days.   folic acid  1 MG tablet Commonly known as: FOLVITE  Take 1 tablet (1 mg total) by mouth daily.   midodrine  5 MG tablet Commonly known as: PROAMATINE  Take 1 tablet (5 mg total) by mouth 3 (three) times daily as needed (sbp<100).   pantoprazole  40 MG tablet Commonly known as: PROTONIX  Take 1 tablet (40 mg total) by mouth daily.               Durable Medical Equipment  (From admission, onward)           Start      Ordered   09/05/23 1128  For home use only DME 4 wheeled rolling walker with seat  Once       Question:  Patient needs a walker to treat with the following condition  Answer:  Weakness generalized   09/05/23 1127            Follow-up Information     [facility] .   Specialty: Emergency Medicine Why: If symptoms worsen Contact information: [address] [place]  [phone]        [facility] Call .   Contact information: [address] [facility] [address] [place] [address] [phone]         [facility] Follow up.   Why: Rollator arranged- to be delivered to pt prior to discharge Contact information: [address]  [address] [place], [place] [address] [phone]               Allergies  Allergen Reactions   Xylocaine [Lidocaine Hcl]     seizure   Morphine      The results of significant diagnostics from this hospitalization (including imaging, microbiology, ancillary and laboratory) are listed below for reference.    Microbiology: Recent Results (from the past 240 hours)  Gastrointestinal Panel by PCR , Stool     Status: Abnormal   Collection Time: 08/31/23  6:16 AM   Specimen: Stool  Result Value Ref Range Status   Campylobacter species NOT DETECTED NOT DETECTED Final   Plesimonas shigelloides NOT DETECTED NOT DETECTED Final   Salmonella species NOT DETECTED NOT DETECTED Final   Yersinia enterocolitica NOT DETECTED NOT DETECTED Final   Vibrio species NOT DETECTED NOT DETECTED Final   Vibrio cholerae NOT DETECTED NOT DETECTED Final   Enteroaggregative E coli (EAEC) NOT DETECTED NOT DETECTED Final   Enteropathogenic E coli (EPEC) DETECTED (A) NOT DETECTED Final    Comment: CRITICAL RESULT CALLED TO, READ BACK BY AND VERIFIED WITH: KELLY TRUONG RN 09/01/23 1017 KG    Enterotoxigenic E coli (ETEC) NOT DETECTED NOT DETECTED Final   Shiga like toxin producing E coli  (STEC) NOT DETECTED NOT DETECTED Final   Shigella/Enteroinvasive E coli (EIEC) NOT DETECTED NOT DETECTED Final   Cryptosporidium NOT DETECTED NOT DETECTED Final   Cyclospora cayetanensis NOT DETECTED NOT DETECTED Final   Entamoeba histolytica NOT DETECTED NOT DETECTED Final   Giardia lamblia NOT DETECTED NOT DETECTED Final   Adenovirus F40/41 DETECTED (A) NOT DETECTED Final   Astrovirus  NOT DETECTED NOT DETECTED Final   Norovirus GI/GII NOT DETECTED NOT DETECTED Final   Rotavirus A NOT DETECTED NOT DETECTED Final   Sapovirus (I, II, IV, and V) NOT DETECTED NOT DETECTED Final    Comment: Performed at [facility], [address], [place], [place] [address]  C Difficile Quick Screen w PCR reflex     Status: None   Collection Time: 08/31/23  6:16 AM   Specimen: STOOL  Result Value Ref Range Status   C Diff antigen NEGATIVE NEGATIVE Final   C Diff toxin NEGATIVE NEGATIVE Final   C Diff interpretation No C. difficile detected.  Final    Comment: Performed at [facility], [address] [place], [place] [address]    Procedures/Studies: [place] Abd Portable 1V Result Date: 09/01/2023 CLINICAL DATA:  Intractable nausea and vomiting. EXAM: PORTABLE ABDOMEN - 1 VIEW COMPARISON:  Abdomen and pelvis CT dated 08/30/2023. FINDINGS: Normal bowel-gas pattern. Cholecystectomy clips and single surgical clip in the right lower abdomen. Unremarkable bones. IMPRESSION: Negative. Electronically Signed   By: Elspeth Bathe M.D.   On: 09/01/2023 10:28   CT ABDOMEN PELVIS W CONTRAST Result Date: 08/30/2023 CLINICAL DATA:  abdominal pain, nausea, vomiting EXAM: CT ABDOMEN AND PELVIS WITH CONTRAST TECHNIQUE: Multidetector CT imaging of the abdomen and pelvis was performed using the standard protocol following bolus administration of intravenous contrast. RADIATION DOSE REDUCTION: This exam was performed according to the departmental dose-optimization program which includes automated exposure control,  adjustment of the mA and/or kV according to patient size and/or use of iterative reconstruction technique. CONTRAST:  75mL OMNIPAQUE  IOHEXOL  350 MG/ML SOLN COMPARISON:  CT abdomen pelvis 12/26/2012 FINDINGS: Lower chest: No acute abnormality. Hepatobiliary: The hepatic parenchyma is diffusely hypodense compared to the splenic parenchyma consistent with fatty infiltration. No focal liver abnormality. Status post cholecystectomy. No biliary dilatation. Pancreas: Unremarkable. No pancreatic ductal dilatation or surrounding inflammatory changes. Spleen: Normal in size without focal abnormality. Adrenals/Urinary Tract: Chronic stable 1.2 cm left adrenal nodule with a density of 126 Hounsfield units suggestive of a adrenal adenoma. Bilateral kidneys enhance symmetrically. No hydronephrosis. No hydroureter. The urinary bladder is decompressed and unremarkable. Stomach/Bowel: Roux-en-Y gastric bypass surgical changes. Stomach is within normal limits. No evidence of bowel wall thickening or dilatation. Appendix appears normal. Vascular/Lymphatic: No abdominal aorta or iliac aneurysm. Moderate to severe atherosclerotic plaque of the aorta and its branches. No abdominal, pelvic, or inguinal lymphadenopathy. Reproductive: Uterine fundus subcentimeter intramural fibroid. Uterus and bilateral adnexa are unremarkable. Other: No intraperitoneal free fluid. No intraperitoneal free gas. No organized fluid collection. Musculoskeletal: No abdominal wall hernia or abnormality. No suspicious lytic or blastic osseous lesions. No acute displaced fracture. IMPRESSION: 1. 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 2. Hepatic steatosis. 3. Uterine fundus subcentimeter intramural fibroid. 4.  Aortic Atherosclerosis (ICD10-I70.0). Electronically Signed   By: Morgane  Naveau M.D.   On: 08/30/2023 17:36   DG Chest 2 View Result Date: 08/30/2023 CLINICAL DATA:   Shortness of breath for 4 days. Tingling in hands and feet. Nausea and vomiting. EXAM: CHEST - 2 VIEW COMPARISON:  05/22/2022 FINDINGS: Normal heart size and pulmonary vascularity. No focal airspace disease or consolidation in the lungs. No blunting of costophrenic angles. No pneumothorax. Mediastinal contours appear intact. Surgical clips in the right upper quadrant. Mild degenerative changes in the spine and shoulders. IMPRESSION: No active cardiopulmonary disease. Electronically Signed   By: Elsie Gravely M.D.   On:  08/30/2023 16:17    Labs: BNP (last 3 results) No results for input(s): BNP in the last 8760 hours. Basic Metabolic Panel: Recent Labs  Lab 09/01/23 0514 09/02/23 9357 09/03/23 0656 09/04/23 0620 09/05/23 0444 09/06/23 0520 09/07/23 0727  NA 134* 136 138 139 138  --  140  K 4.0 3.6 4.1 4.1 3.9  --  4.2  CL 98 100 100 101 104  --  106  CO2 27 24 29 25 25   --  25  GLUCOSE 134* 94 103* 95 203*  --  117*  BUN 6 <5* <5* <5* <5*  --  <5*  CREATININE 0.67 0.59 0.64 0.62 0.59 0.55 0.52  CALCIUM 8.1* 8.3* 8.8* 8.7* 8.6*  --  8.7*  MG 1.6* 1.6* 1.9 1.7 1.7  --   --   PHOS 2.9  --   --   --   --   --   --    Liver Function Tests: Recent Labs  Lab 09/01/23 0514 09/03/23 0656 09/04/23 0620 09/05/23 0444 09/07/23 0727  AST 35 43* 39 35 36  ALT 27 36 31 29 26   ALKPHOS 49 50 56 54 51  BILITOT 0.3 0.6 0.7 0.4 0.5  PROT <3.0* 4.9* 4.9* 4.8* 5.4*  ALBUMIN 2.4* 2.6* 2.7* 2.4* 2.6*   No results for input(s): LIPASE, AMYLASE in the last 168 hours. No results for input(s): AMMONIA in the last 168 hours. CBC: Recent Labs  Lab 09/01/23 0514 09/02/23 1154 09/04/23 0620 09/05/23 0444 09/07/23 0727  WBC 3.1* 2.6* 2.0* 2.7* 2.5*  HGB 11.1* 11.8* 12.1 11.6* 11.7*  HCT 32.9* 35.2* 36.8 35.2* 36.1  MCV 97.6 99.4 101.1* 102.3* 101.7*  PLT 142* 126* 160 150 210   CBG: No results for input(s): GLUCAP in the last 168 hours. Hgb A1c No results for input(s):  HGBA1C in the last 72 hours. Anemia work up No results for input(s): VITAMINB12, FOLATE, FERRITIN, TIBC, IRON, RETICCTPCT in the last 72 hours. Cardiac Enzymes: No results for input(s): CKTOTAL, CKMB, CKMBINDEX, TROPONINI in the last 168 hours. BNP: Invalid input(s): POCBNP D-Dimer No results for input(s): DDIMER in the last 72 hours. Lipid Profile No results for input(s): CHOL, HDL, LDLCALC, TRIG, CHOLHDL, LDLDIRECT in the last 72 hours. Thyroid function studies No results for input(s): TSH, T4TOTAL, T3FREE, THYROIDAB in the last 72 hours.  Invalid input(s): FREET3 Urinalysis    Component Value Date/Time   COLORURINE YELLOW 08/30/2023 1949   APPEARANCEUR CLEAR 08/30/2023 1949   LABSPEC 1.039 (H) 08/30/2023 1949   PHURINE 6.0 08/30/2023 1949   GLUCOSEU NEGATIVE 08/30/2023 1949   HGBUR NEGATIVE 08/30/2023 1949   BILIRUBINUR NEGATIVE 08/30/2023 1949   KETONESUR 20 (A) 08/30/2023 1949   PROTEINUR NEGATIVE 08/30/2023 1949   UROBILINOGEN 1.0 05/20/2014 1231   NITRITE NEGATIVE 08/30/2023 1949   LEUKOCYTESUR MODERATE (A) 08/30/2023 1949   Sepsis Labs Recent Labs  Lab 09/02/23 1154 09/04/23 0620 09/05/23 0444 09/07/23 0727  WBC 2.6* 2.0* 2.7* 2.5*   Microbiology Recent Results (from the past 240 hours)  Gastrointestinal Panel by PCR , Stool     Status: Abnormal   Collection Time: 08/31/23  6:16 AM   Specimen: Stool  Result Value Ref Range Status   Campylobacter species NOT DETECTED NOT DETECTED Final   Plesimonas shigelloides NOT DETECTED NOT DETECTED Final   Salmonella species NOT DETECTED NOT DETECTED Final   Yersinia enterocolitica NOT DETECTED NOT DETECTED Final   Vibrio species NOT DETECTED NOT DETECTED Final   Vibrio cholerae  NOT DETECTED NOT DETECTED Final   Enteroaggregative E coli (EAEC) NOT DETECTED NOT DETECTED Final   Enteropathogenic E coli (EPEC) DETECTED (A) NOT DETECTED Final    Comment: CRITICAL RESULT  CALLED TO, READ BACK BY AND VERIFIED WITH: KELLY TRUONG RN 09/01/23 1017 KG    Enterotoxigenic E coli (ETEC) NOT DETECTED NOT DETECTED Final   Shiga like toxin producing E coli (STEC) NOT DETECTED NOT DETECTED Final   Shigella/Enteroinvasive E coli (EIEC) NOT DETECTED NOT DETECTED Final   Cryptosporidium NOT DETECTED NOT DETECTED Final   Cyclospora cayetanensis NOT DETECTED NOT DETECTED Final   Entamoeba histolytica NOT DETECTED NOT DETECTED Final   Giardia lamblia NOT DETECTED NOT DETECTED Final   Adenovirus F40/41 DETECTED (A) NOT DETECTED Final   Astrovirus NOT DETECTED NOT DETECTED Final   Norovirus GI/GII NOT DETECTED NOT DETECTED Final   Rotavirus A NOT DETECTED NOT DETECTED Final   Sapovirus (I, II, IV, and V) NOT DETECTED NOT DETECTED Final    Comment: Performed at [facility], [address] [place], [place] [address]  C Difficile Quick Screen w PCR reflex     Status: None   Collection Time: 08/31/23  6:16 AM   Specimen: STOOL  Result Value Ref Range Status   C Diff antigen NEGATIVE NEGATIVE Final   C Diff toxin NEGATIVE NEGATIVE Final   C Diff interpretation No C. difficile detected.  Final    Comment: Performed at [facility], [address] [place], [place] [address]   Time coordinating discharge: 35 minutes  SIGNED: Mennie LAMY, MD   [facility] 09/07/2023, 9:50 AM  If 7PM-7AM, please contact night-coverage [email or web address]

## 2023-09-07 NOTE — Progress Notes (Signed)
 Order to discharge patient home. Discharge instructions/AVS given to and reviewed with patient. Education provided as needed. Patient verbalized understanding. 1 PIV removed by this RN. DME rolling walker delivered to patient prior to discharge. Personal belongings sent home with the patient. Discharge nurse assisted patient. Home via private vehicle, picked up by daughter.  Mliss Slight, RN
